# Patient Record
Sex: Female | Born: 1995 | Race: White | Hispanic: Yes | Marital: Married | Smoking: Never smoker
Health system: Southern US, Community
[De-identification: ages and names within clinical notes are randomized; demographics above are authoritative.]

## PROBLEM LIST (undated history)

## (undated) DIAGNOSIS — Z789 Other specified health status: Secondary | ICD-10-CM

## (undated) DIAGNOSIS — O24419 Gestational diabetes mellitus in pregnancy, unspecified control: Secondary | ICD-10-CM

## (undated) HISTORY — PX: WISDOM TOOTH EXTRACTION: SHX21

---
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
## 2017-10-07 ENCOUNTER — Encounter (HOSPITAL_COMMUNITY): Payer: Self-pay | Admitting: Emergency Medicine

## 2017-10-07 ENCOUNTER — Emergency Department (HOSPITAL_COMMUNITY): Payer: Self-pay

## 2017-10-07 ENCOUNTER — Emergency Department (HOSPITAL_COMMUNITY)
Admission: EM | Admit: 2017-10-07 | Discharge: 2017-10-08 | Disposition: A | Discharge status: Home / Self Care | Payer: Self-pay | Attending: Emergency Medicine | Admitting: Emergency Medicine

## 2017-10-07 ENCOUNTER — Emergency Department (HOSPITAL_COMMUNITY)
Admission: EM | Admit: 2017-10-07 | Discharge: 2017-10-07 | Disposition: A | Discharge status: Home / Self Care | Payer: Self-pay | Attending: Emergency Medicine | Admitting: Emergency Medicine

## 2017-10-07 ENCOUNTER — Other Ambulatory Visit: Payer: Self-pay

## 2017-10-07 DIAGNOSIS — S0990XA Unspecified injury of head, initial encounter: Secondary | ICD-10-CM | POA: Insufficient documentation

## 2017-10-07 DIAGNOSIS — W228XXA Striking against or struck by other objects, initial encounter: Secondary | ICD-10-CM | POA: Insufficient documentation

## 2017-10-07 DIAGNOSIS — R51 Headache: Secondary | ICD-10-CM | POA: Insufficient documentation

## 2017-10-07 DIAGNOSIS — Z5321 Procedure and treatment not carried out due to patient leaving prior to being seen by health care provider: Secondary | ICD-10-CM | POA: Insufficient documentation

## 2017-10-07 DIAGNOSIS — Y9389 Activity, other specified: Secondary | ICD-10-CM | POA: Insufficient documentation

## 2017-10-07 DIAGNOSIS — Y998 Other external cause status: Secondary | ICD-10-CM | POA: Insufficient documentation

## 2017-10-07 DIAGNOSIS — Y92000 Kitchen of unspecified non-institutional (private) residence as  the place of occurrence of the external cause: Secondary | ICD-10-CM | POA: Insufficient documentation
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
## 2017-10-07 NOTE — ED Triage Notes (Signed)
Patient left [facility]. earlier because she had a flight; decided she needed to be seen before taking this flight to make sure she was okay.

## 2017-10-07 NOTE — ED Notes (Signed)
Pt states she needs to leave because she has a flight in [place] in 1 1/2 hours and her family has her belongings and they are leaving to fly home.  States she is unable to stay to be seen.  Pt [facility] to [facility] to speak with pt.  Pt leaving AMA.

## 2017-10-08 MED ORDER — ONDANSETRON 4 MG PO TBDP: 4.0000 mg | ORAL_TABLET | Freq: Three times a day (TID) | ORAL | 0 refills | Status: DC | PRN
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
## 2017-10-08 NOTE — Discharge Instructions (Signed)
°  Head Injury You have been seen today for a head injury. It does not appear to be serious at this time.  Close observation: The close observation period is usually 6 hours from the injury. This includes staying awake and having a trustworthy adult monitor you to assure your condition does not worsen. You should be in regular contact with this person and ideally, they should be able to monitor you in person.  Secondary observation: The secondary observation period is usually 24 hours from the injury. You are allowed to sleep during this time. A trustworthy adult should intermittently monitor you to assure your condition does not worsen.   Overall head injury/concussion care: Rest: Be sure to get plenty of rest. You will need more rest and sleep while you recover. Hydration: Be sure to stay well hydrated by having a goal of drinking about 0.5 liters of water an hour. Zofran: May use Zofran, as needed for nausea.  Pain:  Antiinflammatory medications: Take 600 mg of ibuprofen every 6 hours or 440 mg (over the counter dose) to 500 mg (prescription dose) of naproxen every 12 hours or for the next 3 days. After this time, these medications may be used as needed for pain. Take these medications with food to avoid upset stomach. Choose only one of these medications, do not take them together. Tylenol: Should you continue to have additional pain while taking the ibuprofen or naproxen, you may add in tylenol as needed. Your daily total maximum amount of tylenol from all sources should be limited to 4000mg /day for persons without liver problems, or 2000mg /day for those with liver problems. Return to sports and activities: In general, you may return to normal activities once symptoms have subsided, however, you would ideally be cleared by a primary care provider or other qualified medical professional prior to return to these activities.  Follow up: Follow up with the concussion clinic or your primary care  provider for further management of this issue. Return: Return to the ED should you begin to have confusion, abnormal behavior, aggression, violence, or personality changes, repeated vomiting, vision loss, numbness or weakness on one side of the body, difficulty standing due to dizziness, significantly worsening pain, or any other major concerns.

## 2018-09-28 ENCOUNTER — Other Ambulatory Visit: Payer: Self-pay

## 2018-09-28 ENCOUNTER — Inpatient Hospital Stay (HOSPITAL_COMMUNITY)
Admission: EM | Admit: 2018-09-28 | Discharge: 2018-09-29 | Disposition: A | Discharge status: Home / Self Care | Payer: Medicaid Other | Attending: Obstetrics and Gynecology | Admitting: Obstetrics and Gynecology

## 2018-09-28 ENCOUNTER — Inpatient Hospital Stay (HOSPITAL_COMMUNITY): Payer: Medicaid Other

## 2018-09-28 ENCOUNTER — Encounter (HOSPITAL_COMMUNITY): Payer: Self-pay | Admitting: Emergency Medicine

## 2018-09-28 DIAGNOSIS — O468X1 Other antepartum hemorrhage, first trimester: Secondary | ICD-10-CM | POA: Diagnosis not present

## 2018-09-28 DIAGNOSIS — R109 Unspecified abdominal pain: Secondary | ICD-10-CM | POA: Insufficient documentation

## 2018-09-28 DIAGNOSIS — Z3A10 10 weeks gestation of pregnancy: Secondary | ICD-10-CM | POA: Diagnosis not present

## 2018-09-28 DIAGNOSIS — O418X10 Other specified disorders of amniotic fluid and membranes, first trimester, not applicable or unspecified: Secondary | ICD-10-CM

## 2018-09-28 DIAGNOSIS — O219 Vomiting of pregnancy, unspecified: Secondary | ICD-10-CM | POA: Diagnosis not present

## 2018-09-28 DIAGNOSIS — O208 Other hemorrhage in early pregnancy: Secondary | ICD-10-CM | POA: Insufficient documentation

## 2018-09-28 DIAGNOSIS — Z3491 Encounter for supervision of normal pregnancy, unspecified, first trimester: Secondary | ICD-10-CM

## 2018-09-28 DIAGNOSIS — O209 Hemorrhage in early pregnancy, unspecified: Secondary | ICD-10-CM

## 2018-09-28 HISTORY — DX: Other specified health status: Z78.9

## 2018-09-28 LAB — BASIC METABOLIC PANEL
Anion gap: 10 (ref 5–15)
BUN: 7 mg/dL (ref 6–20)
CO2: 20 mmol/L — ABNORMAL LOW (ref 22–32)
Calcium: 8.8 mg/dL — ABNORMAL LOW (ref 8.9–10.3)
Chloride: 104 mmol/L (ref 98–111)
Creatinine, Ser: 0.54 mg/dL (ref 0.44–1.00)
GFR calc Af Amer: >60 mL/min (ref >60)
GFR calc non Af Amer: >60 mL/min (ref >60)
Glucose, Bld: 97 mg/dL (ref 70–99)
Potassium: 3.4 mmol/L — ABNORMAL LOW (ref 3.5–5.1)
Sodium: 134 mmol/L — ABNORMAL LOW (ref 135–145)

## 2018-09-28 LAB — CBC WITH DIFFERENTIAL/PLATELET
Abs Immature Granulocytes: 0.02 10*3/uL (ref 0.00–0.07)
Basophils Absolute: 0 10*3/uL (ref 0.0–0.1)
Basophils Relative: 0 %
Eosinophils Absolute: 0 10*3/uL (ref 0.0–0.5)
Eosinophils Relative: 1 %
HCT: 39.8 % (ref 36.0–46.0)
Hemoglobin: 13.4 g/dL (ref 12.0–15.0)
Immature Granulocytes: 0 %
Lymphocytes Relative: 22 %
Lymphs Abs: 1.5 10*3/uL (ref 0.7–4.0)
MCH: 29.3 pg (ref 26.0–34.0)
MCHC: 33.7 g/dL (ref 30.0–36.0)
MCV: 86.9 fL (ref 80.0–100.0)
Monocytes Absolute: 0.9 10*3/uL (ref 0.1–1.0)
Monocytes Relative: 13 %
Neutro Abs: 4.3 10*3/uL (ref 1.7–7.7)
Neutrophils Relative %: 64 %
Platelets: 241 10*3/uL (ref 150–400)
RBC: 4.58 MIL/uL (ref 3.87–5.11)
RDW: 11.9 % (ref 11.5–15.5)
WBC: 6.8 10*3/uL (ref 4.0–10.5)
nRBC: 0 % (ref 0.0–0.2)

## 2018-09-28 LAB — URINALYSIS, ROUTINE W REFLEX MICROSCOPIC
Bilirubin Urine: NEGATIVE
Glucose, UA: NEGATIVE mg/dL
Ketones, ur: NEGATIVE mg/dL
Nitrite: POSITIVE — AB
Protein, ur: NEGATIVE mg/dL
Specific Gravity, Urine: 1.027 (ref 1.005–1.030)
pH: 6 (ref 5.0–8.0)

## 2018-09-28 LAB — I-STAT BETA HCG BLOOD, ED (MC, WL, AP ONLY): I-stat hCG, quantitative: >2000 m[IU]/mL — ABNORMAL HIGH (ref <5)

## 2018-09-28 LAB — HCG, QUANTITATIVE, PREGNANCY: hCG, Beta Chain, Quant, S: 62071 m[IU]/mL — ABNORMAL HIGH (ref <5)

## 2018-09-28 NOTE — ED Triage Notes (Signed)
Patient reports right lateral abdominal cramping with spotting and emesis onset today , she is [redacted] weeks pregnant G2P1. Denies fever or chills .

## 2018-09-28 NOTE — MAU Note (Signed)
Pt reports she started having some abd cramping this afternoon. Had some vag bleeding  Earlier this evening but none now. Went to [facility] and was transferred to MAU for further evaluation.

## 2018-09-29 DIAGNOSIS — O418X10 Other specified disorders of amniotic fluid and membranes, first trimester, not applicable or unspecified: Secondary | ICD-10-CM

## 2018-09-29 DIAGNOSIS — O219 Vomiting of pregnancy, unspecified: Secondary | ICD-10-CM

## 2018-09-29 DIAGNOSIS — O468X1 Other antepartum hemorrhage, first trimester: Secondary | ICD-10-CM

## 2018-09-29 DIAGNOSIS — Z3A10 10 weeks gestation of pregnancy: Secondary | ICD-10-CM

## 2018-09-29 DIAGNOSIS — O209 Hemorrhage in early pregnancy, unspecified: Secondary | ICD-10-CM

## 2018-09-29 LAB — WET PREP, GENITAL
Sperm: NONE SEEN
Trich, Wet Prep: NONE SEEN
Yeast Wet Prep HPF POC: NONE SEEN

## 2018-09-29 MED ORDER — PROMETHAZINE HCL 25 MG PO TABS: 12.5000 mg | ORAL_TABLET | Freq: Four times a day (QID) | ORAL | 0 refills | Status: DC | PRN

## 2018-09-29 NOTE — MAU Provider Note (Signed)
Chief Complaint: Abdominal Cramping/Emesis/Spotting ([redacted] weeks pregnant)   None     SUBJECTIVE HPI: Tammie Diaz is a 23 y.o. Tammie Diaz at 10 weeks by LMP who presents to the ED and was transferred to maternity admissions reporting abdominal cramping, spotting, and nausea vomiting today with [facility]. She reports the bleeding is light, pink to light red, noted when wiping. She reports nausea with vomiting most days but not every day.  There are no other symptoms. She has not tried any treatments.     HPI  Past Medical History:  Diagnosis Date  . Medical history non-contributory    Past Surgical History:  Procedure Laterality Date  . CESAREAN SECTION     Social History   Socioeconomic History  . Marital status: Married    Spouse name: Not on file  . Number of children: Not on file  . Years of education: Not on file  . Highest education level: Not on file  Occupational History  . Not on file  Social Needs  . Financial resource strain: Not on file  . Food insecurity    Worry: Not on file    Inability: Not on file  . Transportation needs    Medical: Not on file    Non-medical: Not on file  Tobacco Use  . Smoking status: Never Smoker  . Smokeless tobacco: Never Used  Substance and Sexual Activity  . Alcohol use: Yes  . Drug use: Never  . Sexual activity: Yes  Lifestyle  . Physical activity    Days per week: Not on file    Minutes per session: Not on file  . Stress: Not on file  Relationships  . Social Musicianconnections    Talks on phone: Not on file    Gets together: Not on file    Attends religious service: Not on file    Active member of club or organization: Not on file    Attends meetings of clubs or organizations: Not on file    Relationship status: Not on file  . Intimate partner violence    Fear of current or ex partner: Not on file    Emotionally abused: Not on file    Physically abused: Not on file    Forced sexual activity: Not on file   Other Topics Concern  . Not on file  Social History Narrative  . Not on file   No current facility-administered medications on file prior to encounter.    Current Outpatient Medications on File Prior to Encounter  Medication Sig Dispense Refill  . ondansetron (ZOFRAN ODT) 4 MG disintegrating tablet Take 1 tablet (4 mg total) by mouth every 8 (eight) hours as needed for nausea or vomiting. 20 tablet 0   No Known Allergies  ROS:  Review of Systems  Constitutional: Negative for chills, fatigue and fever.  Respiratory: Negative for shortness of breath.   Cardiovascular: Negative for chest pain.  Gastrointestinal: Positive for abdominal pain, nausea and vomiting.  Genitourinary: Positive for pelvic pain and vaginal bleeding. Negative for difficulty urinating, dysuria, flank pain, vaginal discharge and vaginal pain.  Neurological: Negative for dizziness and headaches.  Psychiatric/Behavioral: Negative.      I have reviewed patient's Past Medical Hx, Surgical Hx, Family Hx, Social Hx, medications and allergies.   Physical Exam   Patient Vitals for the past 24 hrs:  BP Temp Temp src Pulse Resp Height Weight  09/29/18 0028 114/69 97.9 F (36.6 C) Oral 61 16 - -  09/28/18 2320  105/66 97.8 F (36.6 C) Oral 63 17 - -  09/28/18 2237 115/69 97.6 F (36.4 C) - 73 18 5' 2.5" (1.588 m) 68 kg   Constitutional: Well-developed, well-nourished female in no acute distress.  Cardiovascular: normal rate Respiratory: normal effort GI: Abd soft, non-tender. Pos BS x 4 MS: Extremities nontender, no edema, normal ROM Neurologic: Alert and oriented x 4.  GU: Neg CVAT.  PELVIC EXAM: Wet prep collected by blind swab    LAB RESULTS Results for orders placed or performed during the hospital encounter of 09/28/18 (from the past 24 hour(s))  CBC with Differential     Status: None   Collection Time: 09/28/18  8:30 PM  Result Value Ref Range   WBC 6.8 4.0 - 10.5 K/uL   RBC 4.58 3.87 - 5.11  MIL/uL   Hemoglobin 13.4 12.0 - 15.0 g/dL   HCT 18.8 67.7 - 37.3 %   MCV 86.9 80.0 - 100.0 fL   MCH 29.3 26.0 - 34.0 pg   MCHC 33.7 30.0 - 36.0 g/dL   RDW 66.8 15.9 - 47.0 %   Platelets 241 150 - 400 K/uL   nRBC 0.0 0.0 - 0.2 %   Neutrophils Relative % 64 %   Neutro Abs 4.3 1.7 - 7.7 K/uL   Lymphocytes Relative 22 %   Lymphs Abs 1.5 0.7 - 4.0 K/uL   Monocytes Relative 13 %   Monocytes Absolute 0.9 0.1 - 1.0 K/uL   Eosinophils Relative 1 %   Eosinophils Absolute 0.0 0.0 - 0.5 K/uL   Basophils Relative 0 %   Basophils Absolute 0.0 0.0 - 0.1 K/uL   Immature Granulocytes 0 %   Abs Immature Granulocytes 0.02 0.00 - 0.07 K/uL  Basic metabolic panel     Status: Abnormal   Collection Time: 09/28/18  8:30 PM  Result Value Ref Range   Sodium 134 (L) 135 - 145 mmol/L   Potassium 3.4 (L) 3.5 - 5.1 mmol/L   Chloride 104 98 - 111 mmol/L   CO2 20 (L) 22 - 32 mmol/L   Glucose, Bld 97 70 - 99 mg/dL   BUN 7 6 - 20 mg/dL   Creatinine, Ser 7.61 0.44 - 1.00 mg/dL   Calcium 8.8 (L) 8.9 - 10.3 mg/dL   GFR calc non Af Amer >60 >60 mL/min   GFR calc Af Amer >60 >60 mL/min   Anion gap 10 5 - 15  hCG, quantitative, pregnancy     Status: Abnormal   Collection Time: 09/28/18  8:30 PM  Result Value Ref Range   hCG, Beta Chain, Quant, S 62,071 (H) <5 mIU/mL  Urinalysis, Routine w reflex microscopic     Status: Abnormal   Collection Time: 09/28/18  8:30 PM  Result Value Ref Range   Color, Urine YELLOW YELLOW   APPearance HAZY (A) CLEAR   Specific Gravity, Urine 1.027 1.005 - 1.030   pH 6.0 5.0 - 8.0   Glucose, UA NEGATIVE NEGATIVE mg/dL   Hgb urine dipstick MODERATE (A) NEGATIVE   Bilirubin Urine NEGATIVE NEGATIVE   Ketones, ur NEGATIVE NEGATIVE mg/dL   Protein, ur NEGATIVE NEGATIVE mg/dL   Nitrite POSITIVE (A) NEGATIVE   Leukocytes,Ua SMALL (A) NEGATIVE   RBC / HPF 0-5 0 - 5 RBC/hpf   WBC, UA 21-50 0 - 5 WBC/hpf   Bacteria, UA MANY (A) NONE SEEN   Squamous Epithelial / LPF 6-10 0 - 5    Mucus PRESENT   ABO/Rh     Status:  None   Collection Time: 09/28/18  8:30 PM  Result Value Ref Range   ABO/RH(D)      O POS Performed at [facility] [address] [place], [place] [address]   I-Stat Beta hCG blood, ED ([facility], [facility], [facility] only)     Status: Abnormal   Collection Time: 09/28/18  9:05 PM  Result Value Ref Range   I-stat hCG, quantitative >2,000.0 (H) <5 mIU/mL   Comment 3            --/--/O POS Performed at [facility] [address], [place], [place] [address]  [phone])  IMAGING US Ob Less Than 14 Weeks With Ob Transvaginal  Result Date: 09/29/2018 CLINICAL DATA:  Spotting EXAM: OBSTETRIC <14 WK [place] AND TRANSVAGINAL OB US TECHNIQUE: Both transabdominal and transvaginal ultrasound examinations were performed for complete evaluation of the gestation as well as the maternal uterus, adnexal regions, and pelvic cul-de-sac. Transvaginal technique was performed to assess early pregnancy. COMPARISON:  None. FINDINGS: Intrauterine gestational sac: Single Yolk sac:  Visualized Embryo:  Visualized Cardiac Activity: Visualized Heart Rate: 171 bpm MSD:   mm    w     d CRL:  20.6 mm   8 w   4 d                  [place] EDC: 05/06/2019 Subchorionic hemorrhage:  Small subchorionic hemorrhage Maternal uterus/adnexae: No adnexal mass or free fluid. IMPRESSION: Eight week 4 day intrauterine pregnancy. Fetal heart rate 171 beats per minute. Small subchorionic hemorrhage. Electronically Signed   By: Rolm Baptise M.D.   On: 09/29/2018 00:03    MAU Management/MDM: Orders Placed This Encounter  Procedures  . Wet prep, genital  . US OB LESS THAN 14 WEEKS WITH OB TRANSVAGINAL  . CBC with Differential  . Basic metabolic panel  . hCG, quantitative, pregnancy  . Urinalysis, Routine w reflex microscopic  . I-Stat Beta hCG blood, ED ([facility], [facility], [facility] only)  . ABO/Rh  . Discharge patient    No orders of the defined types were placed in this encounter.   [place] confirms IUP with small subchorionic  hemorrhage, likely explained the pain and bleeding. Discussed results with pt today.  EDD changed to reflect [place], as it is 13 days different from LMP dating.  Pt to start prenatal care as soon as possible, list of providers given.  Will treat n/v with Phenergan, Rx sent.  Return to MAU with worsening pain or bleeding.  Pt discharged with strict return precautions.  ASSESSMENT 1. Normal IUP (intrauterine pregnancy) on prenatal ultrasound, first trimester   2. Vaginal bleeding in pregnancy, first trimester   3. Subchorionic hemorrhage of placenta in first trimester, single or unspecified fetus     PLAN Discharge home Allergies as of 09/29/2018   No Known Allergies     Medication List    STOP taking these medications   ondansetron 4 MG disintegrating tablet Commonly known as: Zofran ODT      Follow-up [facility] Follow up.   Specialty: Obstetrics and Gynecology Why: Or prenatal provider of your choice. Return to MAU as needed for emergencies. Contact information: [address], [place] Certified Nurse-Midwife 09/29/2018  12:29 AM

## 2018-09-29 NOTE — Discharge Instructions (Signed)
[place] [facility] Providers    [facility]       Phone: [phone]  [facility]   Phone: [phone]  [facility]  Phone: [phone]  [facility]  Phone: [phone]  [facility]  Phone: [phone]  [facility]   Phone: [phone]  [facility]       Phone: [phone]  [facility]    Phone: [phone]   [facility]    Phone: [phone]  [facility]    Phone: [phone]  [facility]    Phone: [phone]  [facility]       Phone: [phone]   [facility]  Phone: [phone]  [facility]    Phone: [phone]  [facility]   Phone: [phone]  [facility]      Phone: [phone]  [facility]    Phone: [phone]   

## 2018-09-30 LAB — ABO/RH: ABO/RH(D): O POS

## 2018-10-02 LAB — GC/CHLAMYDIA PROBE AMP (~~LOC~~) NOT AT ARMC
Chlamydia: NEGATIVE
Neisseria Gonorrhea: NEGATIVE

## 2018-10-07 ENCOUNTER — Telehealth: Payer: Self-pay | Admitting: General Practice

## 2018-10-07 NOTE — Telephone Encounter (Signed)
Unable to reach pt to inform her of change of appointment.  Pt was scheduled on 10/08/2018 for New OB intake.  Rescheduled pt to 10/15/2018 at 2:30pm due to RN will not be available.  Unable to leave a message on voice mail.

## 2018-10-09 ENCOUNTER — Encounter: Payer: Medicaid Other | Admitting: Certified Nurse Midwife

## 2018-10-15 ENCOUNTER — Ambulatory Visit (INDEPENDENT_AMBULATORY_CARE_PROVIDER_SITE_OTHER): Payer: Medicaid Other | Admitting: *Deleted

## 2018-10-15 ENCOUNTER — Other Ambulatory Visit: Payer: Self-pay

## 2018-10-15 DIAGNOSIS — Z3480 Encounter for supervision of other normal pregnancy, unspecified trimester: Secondary | ICD-10-CM | POA: Insufficient documentation

## 2018-10-15 NOTE — Progress Notes (Signed)
    Virtual Visit via Telephone Note  I connected with Tammie Diaz on 10/15/18 at  2:30 PM EDT by telephone and verified that I am speaking with the correct person using two identifiers.  Location: Patient: Tammie Diaz MRN: [id number] Provider: Derl Barrow, RN   I discussed the limitations, risks, security and privacy concerns of performing an evaluation and management service by telephone and the availability of in person appointments. I also discussed with the patient that there may be a patient responsible charge related to this service. The patient expressed understanding and agreed to proceed.   History of Present Illness: PRENATAL INTAKE SUMMARY  Ms. Tammie Diaz presents today New OB Nurse Interview.  OB History    Gravida  2   Para  1   Term  1   Preterm      AB      Living  1     SAB      TAB      Ectopic      Multiple      Live Births  1          I have reviewed the patient's medical, obstetrical, social, and family histories, medications, and available lab results.  SUBJECTIVE She has no unusual complaints   Observations/Objective: Initial nurse interview for history/labs (New OB)  EDD: 05/07/2019 GA: [redacted]w[redacted]d G2P1001 FHT: non face to face interview  GENERAL APPEARANCE: non face to face interviw  Assessment and Plan: Normal pregnancy Prenatal care- [facility] Continue PNV Lab/physical to completed at next visit with provider Pt to sign up for Babyscripts  Follow Up Instructions:   I discussed the assessment and treatment plan with the patient. The patient was provided an opportunity to ask questions and all were answered. The patient agreed with the plan and demonstrated an understanding of the instructions.   The patient was advised to call back or seek an in-person evaluation if the symptoms worsen or if the condition fails to improve as anticipated.  I provided 15 minutes of non-face-to-face time during this encounter.    Derl Barrow, RN

## 2018-10-18 ENCOUNTER — Encounter: Payer: Medicaid Other | Admitting: Student

## 2018-10-30 ENCOUNTER — Other Ambulatory Visit (HOSPITAL_COMMUNITY)
Admission: RE | Admit: 2018-10-30 | Discharge: 2018-10-30 | Disposition: A | Discharge status: Home / Self Care | Payer: Medicaid Other | Source: Ambulatory Visit

## 2018-10-30 ENCOUNTER — Encounter: Payer: Self-pay | Admitting: General Practice

## 2018-10-30 ENCOUNTER — Other Ambulatory Visit: Payer: Self-pay

## 2018-10-30 ENCOUNTER — Ambulatory Visit (INDEPENDENT_AMBULATORY_CARE_PROVIDER_SITE_OTHER): Payer: Medicaid Other

## 2018-10-30 VITALS — BP 112/72 | HR 99 | Temp 99.0°F | Wt 149.0 lb

## 2018-10-30 DIAGNOSIS — Z8751 Personal history of pre-term labor: Secondary | ICD-10-CM | POA: Insufficient documentation

## 2018-10-30 DIAGNOSIS — Z3480 Encounter for supervision of other normal pregnancy, unspecified trimester: Secondary | ICD-10-CM | POA: Insufficient documentation

## 2018-10-30 DIAGNOSIS — Z3A13 13 weeks gestation of pregnancy: Secondary | ICD-10-CM | POA: Diagnosis not present

## 2018-10-30 DIAGNOSIS — O219 Vomiting of pregnancy, unspecified: Secondary | ICD-10-CM | POA: Diagnosis not present

## 2018-10-30 DIAGNOSIS — Z23 Encounter for immunization: Secondary | ICD-10-CM

## 2018-10-30 DIAGNOSIS — Z98891 History of uterine scar from previous surgery: Secondary | ICD-10-CM | POA: Insufficient documentation

## 2018-10-30 HISTORY — DX: Personal history of pre-term labor: Z87.51

## 2018-10-30 MED ORDER — ONDANSETRON 4 MG PO TBDP: 4.0000 mg | ORAL_TABLET | Freq: Four times a day (QID) | ORAL | 1 refills | Status: DC | PRN

## 2018-10-30 NOTE — Progress Notes (Addendum)
Subjective:   Tammie Diaz is a 23 y.o. G2P1001 at [redacted]w[redacted]d by early ultrasound being seen today for her first obstetrical visit.  Her obstetrical history is significant for C/S and PTL.  Patient reports that she had a c/s after a failed induction of 3 days.  Patient goes on to state that she experienced some PTL around 36 weeks and she was 3 cm, but labor was stopped. Patient denies other issues with previous pregnancy and reports having a healthy child at home.   Patient does intend to breast feed. Patient expresses a desire for a repeat c/s. Patient currently taking PNV.   Patient reports vomiting.  She states she has been taking the medication as prescribed, but continues to experience nausea and vomiting.  Patient reports that she has been able to eat minimally and feels dehydrated. However, patient goes on to report ability to tolerate Pedialyte without issue and has consumed prior to her appt.   Patient reports that the FOB is involved.    HISTORY: OB History  Gravida Para Term Preterm AB Living  2 1 1  0 0 1  SAB TAB Ectopic Multiple Live Births  0 0 0 0 1    # Outcome Date GA Lbr Len/2nd Weight Sex Delivery Anes PTL Lv  2 Current           1 Term 07/04/16 [redacted]w[redacted]d  7 lb 7 oz (3.374 kg) F CS-Unspec EPI N LIV     Complications: Short cervix    Last pap smear was done today and results pending.   Past Medical History:  Diagnosis Date  . Medical history non-contributory    Past Surgical History:  Procedure Laterality Date  . CESAREAN SECTION     No family history on file. Social History   Tobacco Use  . Smoking status: Never Smoker  . Smokeless tobacco: Never Used  Substance Use Topics  . Alcohol use: Not Currently  . Drug use: Never   No Known Allergies Current Outpatient Medications on File Prior to Visit  Medication Sig Dispense Refill  . promethazine (PHENERGAN) 25 MG tablet Take 0.5-1 tablets (12.5-25 mg total) by mouth every 6 (six) hours as needed for  nausea. 30 tablet 0   No current facility-administered medications on file prior to visit.     Review of Systems Pertinent items noted in HPI and remainder of comprehensive ROS otherwise negative.  Exam   Vitals:   10/30/18 1013  BP: 112/72  Pulse: 99  Temp: 99 F (37.2 C)  Weight: 149 lb (67.6 kg)      Physical Exam Constitutional:      Appearance: Normal appearance.  Genitourinary:     Vulva normal.     Vaginal discharge (Thick white discharge, no apparent odor. ) present.     No vaginal bleeding.     Cervix is parous.     Cervical discharge and friability present.     No cervical motion tenderness, lesion, erythema, polyp or nabothian cyst.     Uterus is enlarged.     Uterus is not tender.     Genitourinary Comments: Cervical bleeding noted after collection of pap via brush and spatula.  Patient informed of anticipated bleeding/spotting. CV collected.   HENT:     Head: Normocephalic and atraumatic.  Eyes:     Conjunctiva/sclera: Conjunctivae normal.  Neck:     Musculoskeletal: Normal range of motion.  Cardiovascular:     Rate and Rhythm: Normal rate and regular  rhythm.  Pulmonary:     Effort: Pulmonary effort is normal.     Breath sounds: Normal breath sounds.  Abdominal:     General: Abdomen is flat. Bowel sounds are normal.     Palpations: Abdomen is soft.     Tenderness: There is abdominal tenderness (Right lower quadrant at incision site. ).     Comments: LT Incision noted; Healed, tenderness with palpation.   Musculoskeletal: Normal range of motion.  Neurological:     Mental Status: She is alert and oriented to person, place, and time.  Skin:    General: Skin is warm and dry.  Psychiatric:        Mood and Affect: Mood normal.        Thought Content: Thought content normal.        Assessment:   Pregnancy: G2P1001 Patient Active Problem List   Diagnosis Date Noted  . Supervision of other normal pregnancy, antepartum 10/15/2018     Plan:  1.  Supervision of other normal pregnancy, antepartum -Congratulations given and patient welcomed to practice. -Discussed usage of Babyscripts as for documentation of weekly at home blood pressures.  -Discussed usage of virtual visits in midst of coronavirus.   -Encouraged to seek out care at office or emergency room for urgent and/or emergent concerns. -Educated on the nature of [facility] with multiple MDs and other Advanced Practice Providers was explained to patient; also emphasized that residents, students are part of our team. Informed of her right to refuse care as she deems appropriate.  -No questions or concerns.   -Anticipatory guidance for prenatal visits including labs, ultrasounds, and testing. -Encouraged to utilize MyChart to review results, send requests, and have questions addressed.   -Discussed due date based on Early [place]. -Plans to breastfeed and is unsure of PP BC method, but is considering Nexplanon. -Unsure of circumcision if a female child.   Initial labs drawn. Continue prenatal vitamins. Genetic Screening discussed, First trimester screen: ordered. Ultrasound discussed; fetal anatomic survey: ordered. Problem list updated. Routine obstetric precautions reviewed.  - Genetic Screening - Obstetric Panel, Including HIV - Cytology - PAP( [place]) - Cervicovaginal ancillary only( [place]) - Culture, OB Urine  2. History of low transverse cesarean section -Reports desire for repeat. -Informed that she would need to speak/meet with with a surgeon prior to scheduling.  -Will have patient sign release for records.   3. History of preterm labor -Will obtain medical records for review. -Informed that if short cervix is noted,  further intervention may be necessary.  4. Nausea and vomiting during pregnancy prior to [redacted] weeks gestation -Discussed eating habits in setting of N/V including: *Frequent small bland meals throughout  the day. *Sips of fluid -Discussed proper usage of antiemetics. -Will send prescription for Zofran ODT to pharmacy on file. -Encouraged to report to MAU for further evaluation if unable to keep food or fluids down after initiation of Zofran today.  5. Need for immunization against influenza -Influenza offered, accepted, and given.   Return in about 7 weeks (around 12/18/2018) for ROB via Webex/Mychart.   Cherre Robins, CNM 10/30/2018 10:49 AM

## 2018-10-30 NOTE — Patient Instructions (Signed)
Morning Sickness ° °Morning sickness is when a woman feels nauseous during pregnancy. This nauseous feeling may or may not come with vomiting. It often occurs in the morning, but it can be a problem at any time of day. Morning sickness is most common during the first trimester. In some cases, it may continue throughout pregnancy. Although morning sickness is unpleasant, it is usually harmless unless the woman develops severe and continual vomiting (hyperemesis gravidarum), a condition that requires more intense treatment. °What are the causes? °The exact cause of this condition is not known, but it seems to be related to normal hormonal changes that occur in pregnancy. °What increases the risk? °You are more likely to develop this condition if: °· You experienced nausea or vomiting before your pregnancy. °· You had morning sickness during a previous pregnancy. °· You are pregnant with more than one baby, such as twins. °What are the signs or symptoms? °Symptoms of this condition include: °· Nausea. °· Vomiting. °How is this diagnosed? °This condition is usually diagnosed based on your signs and symptoms. °How is this treated? °In many cases, treatment is not needed for this condition. Making some changes to what you eat may help to control symptoms. Your health care provider may also prescribe or recommend: °· Vitamin B6 supplements. °· Anti-nausea medicines. °· Ginger. °Follow these instructions at home: °Medicines °· Take over-the-counter and prescription medicines only as told by your health care provider. Do not use any prescription, over-the-counter, or herbal medicines for morning sickness without first talking with your health care provider. °· Taking multivitamins before getting pregnant can prevent or decrease the severity of morning sickness in most women. °Eating and drinking °· Eat a piece of dry toast or crackers before getting out of bed in the morning. °· Eat 5 or 6 small meals a day. °· Eat dry and  bland foods, such as rice or a baked potato. Foods that are high in carbohydrates are often helpful. °· Avoid greasy, fatty, and spicy foods. °· Have someone cook for you if the smell of any food causes nausea and vomiting. °· If you feel nauseous after taking prenatal vitamins, take the vitamins at night or with a snack. °· Snack on protein foods between meals if you are hungry. Nuts, yogurt, and cheese are good options. °· Drink fluids throughout the day. °· Try ginger ale made with real ginger, ginger tea made from fresh grated ginger, or ginger candies. °General instructions °· Do not use any products that contain nicotine or tobacco, such as cigarettes and e-cigarettes. If you need help quitting, ask your health care provider. °· Get an air purifier to keep the air in your house free of odors. °· Get plenty of fresh air. °· Try to avoid odors that trigger your nausea. °· Consider trying these methods to help relieve symptoms: °? Wearing an acupressure wristband. These wristbands are often worn for seasickness. °? Acupuncture. °Contact a health care provider if: °· Your home remedies are not working and you need medicine. °· You feel dizzy or light-headed. °· You are losing weight. °Get help right away if: °· You have persistent and uncontrolled nausea and vomiting. °· You faint. °· You have severe pain in your abdomen. °Summary °· Morning sickness is when a woman feels nauseous during pregnancy. This nauseous feeling may or may not come with vomiting. °· Morning sickness is most common during the first trimester. °· It often occurs in the morning, but it can be a problem at   any time of day. °· In many cases, treatment is not needed for this condition. Making some changes to what you eat may help to control symptoms. °This information is not intended to replace advice given to you by your health care provider. Make sure you discuss any questions you have with your health care provider. °Document Released:  03/02/2006 Document Revised: 12/22/2016 Document Reviewed: 02/12/2016 °Elsevier Patient Education © 2020 [employer] ° °

## 2018-10-31 ENCOUNTER — Encounter: Payer: Self-pay | Admitting: General Practice

## 2018-10-31 LAB — OBSTETRIC PANEL, INCLUDING HIV
Antibody Screen: NEGATIVE
Basophils Absolute: 0 10*3/uL (ref 0.0–0.2)
Basos: 0 %
EOS (ABSOLUTE): 0 10*3/uL (ref 0.0–0.4)
Eos: 0 %
HIV Screen 4th Generation wRfx: NONREACTIVE
Hematocrit: 41.2 % (ref 34.0–46.6)
Hemoglobin: 13.6 g/dL (ref 11.1–15.9)
Hepatitis B Surface Ag: NEGATIVE
Immature Grans (Abs): 0 10*3/uL (ref 0.0–0.1)
Immature Granulocytes: 0 %
Lymphocytes Absolute: 1 10*3/uL (ref 0.7–3.1)
Lymphs: 13 %
MCH: 29.2 pg (ref 26.6–33.0)
MCHC: 33 g/dL (ref 31.5–35.7)
MCV: 89 fL (ref 79–97)
Monocytes Absolute: 0.6 10*3/uL (ref 0.1–0.9)
Monocytes: 7 %
Neutrophils Absolute: 6.2 10*3/uL (ref 1.4–7.0)
Neutrophils: 80 %
Platelets: 245 10*3/uL (ref 150–450)
RBC: 4.65 x10E6/uL (ref 3.77–5.28)
RDW: 12.6 % (ref 11.7–15.4)
RPR Ser Ql: NONREACTIVE
Rh Factor: POSITIVE
Rubella Antibodies, IGG: 8.11 index (ref >0.99)
WBC: 7.8 10*3/uL (ref 3.4–10.8)

## 2018-11-03 ENCOUNTER — Other Ambulatory Visit (HOSPITAL_COMMUNITY): Payer: Self-pay

## 2018-11-03 DIAGNOSIS — O2342 Unspecified infection of urinary tract in pregnancy, second trimester: Secondary | ICD-10-CM

## 2018-11-03 LAB — CULTURE, OB URINE

## 2018-11-03 LAB — URINE CULTURE, OB REFLEX

## 2018-11-03 MED ORDER — CEPHALEXIN 500 MG PO CAPS: 500.0000 mg | ORAL_CAPSULE | Freq: Four times a day (QID) | ORAL | 0 refills | Status: DC

## 2018-11-05 LAB — CYTOLOGY - PAP: Diagnosis: NEGATIVE

## 2018-11-07 LAB — CERVICOVAGINAL ANCILLARY ONLY
Chlamydia: NEGATIVE
Comment: NEGATIVE
Comment: NORMAL
Neisseria Gonorrhea: NEGATIVE

## 2018-11-11 ENCOUNTER — Encounter: Payer: Self-pay | Admitting: General Practice

## 2018-12-05 ENCOUNTER — Other Ambulatory Visit: Payer: Self-pay | Admitting: *Deleted

## 2018-12-05 DIAGNOSIS — O219 Vomiting of pregnancy, unspecified: Secondary | ICD-10-CM

## 2018-12-05 MED ORDER — ONDANSETRON 4 MG PO TBDP: 4.0000 mg | ORAL_TABLET | Freq: Four times a day (QID) | ORAL | 1 refills | Status: DC | PRN

## 2018-12-11 ENCOUNTER — Ambulatory Visit (HOSPITAL_COMMUNITY)
Admission: RE | Admit: 2018-12-11 | Discharge: 2018-12-11 | Disposition: A | Discharge status: Home / Self Care | Payer: Medicaid Other | Source: Ambulatory Visit

## 2018-12-11 ENCOUNTER — Other Ambulatory Visit: Payer: Self-pay

## 2018-12-11 DIAGNOSIS — Z3A19 19 weeks gestation of pregnancy: Secondary | ICD-10-CM | POA: Diagnosis not present

## 2018-12-11 DIAGNOSIS — Z3480 Encounter for supervision of other normal pregnancy, unspecified trimester: Secondary | ICD-10-CM

## 2018-12-11 DIAGNOSIS — O34219 Maternal care for unspecified type scar from previous cesarean delivery: Secondary | ICD-10-CM

## 2018-12-11 DIAGNOSIS — Z363 Encounter for antenatal screening for malformations: Secondary | ICD-10-CM

## 2018-12-16 ENCOUNTER — Encounter: Payer: Self-pay | Admitting: General Practice

## 2018-12-18 ENCOUNTER — Telehealth: Payer: Medicaid Other | Admitting: Obstetrics and Gynecology

## 2019-01-03 ENCOUNTER — Telehealth (INDEPENDENT_AMBULATORY_CARE_PROVIDER_SITE_OTHER): Payer: Medicaid Other | Admitting: Advanced Practice Midwife

## 2019-01-03 ENCOUNTER — Encounter: Payer: Self-pay | Admitting: Advanced Practice Midwife

## 2019-01-03 ENCOUNTER — Other Ambulatory Visit: Payer: Self-pay

## 2019-01-03 ENCOUNTER — Telehealth: Payer: Self-pay | Admitting: General Practice

## 2019-01-03 DIAGNOSIS — Z3482 Encounter for supervision of other normal pregnancy, second trimester: Secondary | ICD-10-CM | POA: Diagnosis not present

## 2019-01-03 DIAGNOSIS — Z3A22 22 weeks gestation of pregnancy: Secondary | ICD-10-CM

## 2019-01-03 DIAGNOSIS — Z3480 Encounter for supervision of other normal pregnancy, unspecified trimester: Secondary | ICD-10-CM

## 2019-01-03 NOTE — Progress Notes (Signed)
   TELEHEALTH VIRTUAL OBSTETRICS VISIT ENCOUNTER NOTE  I connected with Tammie Diaz on 01/03/19 at 10:50 AM EST by telephone at home and verified that I am speaking with the correct person using two identifiers.   I discussed the limitations, risks, security and privacy concerns of performing an evaluation and management service by telephone and the availability of in person appointments. I also discussed with the patient that there may be a patient responsible charge related to this service. The patient expressed understanding and agreed to proceed.  Subjective:  Tammie Diaz is a 23 y.o. G2P1001 at [redacted]w[redacted]d being followed for ongoing prenatal care.  She is currently monitored for the following issues for this low-risk pregnancy and has Supervision of other normal pregnancy, antepartum; Nausea and vomiting during pregnancy prior to [redacted] weeks gestation; History of preterm labor; and History of low transverse cesarean section on their problem list.  Patient reports no complaints. Reports fetal movement. Denies any contractions, bleeding or leaking of fluid.   The following portions of the patient's history were reviewed and updated as appropriate: allergies, current medications, past family history, past medical history, past social history, past surgical history and problem list.   Objective:   General:  Alert, oriented and cooperative.   Mental Status: Normal mood and affect perceived. Normal judgment and thought content.  Rest of physical exam deferred due to type of encounter  BP 100/77 today  Assessment and Plan:  Pregnancy: G2P1001 at [redacted]w[redacted]d 1. Supervision of other normal pregnancy, antepartum - Routine care - 28 week labs and GTT in January   Preterm labor symptoms and general obstetric precautions including but not limited to vaginal bleeding, contractions, leaking of fluid and fetal movement were reviewed in detail with the patient.  I discussed the assessment and treatment plan  with the patient. The patient was provided an opportunity to ask questions and all were answered. The patient agreed with the plan and demonstrated an understanding of the instructions. The patient was advised to call back or seek an in-person office evaluation/go to [facility]U at [facility] for any urgent or concerning symptoms. Please refer to After Visit Summary for other counseling recommendations.   I provided 12 minutes of non-face-to-face time during this encounter.  Return in about 6 weeks (around 02/14/2019) for In person visit for 28 week labs at GTT .  Future Appointments  Date Time Provider [place]  01/15/2019 11:10 AM Jorje Guild, NP [facility] None  02/13/2019  8:10 AM Laury Deep, CNM [facility] None    Marcille Buffy DNP, CNM  01/03/19  11:07 AM  [facility]

## 2019-01-03 NOTE — Telephone Encounter (Signed)
Appt scheduled for 01/15/2019 has been cancelled due to provider wants pt to follow up around 02/13/2019.  Left message on VM informing pt of reason of cancelling and was asked to give our office a call with any questions or concerns.

## 2019-01-14 DIAGNOSIS — O219 Vomiting of pregnancy, unspecified: Secondary | ICD-10-CM

## 2019-01-15 ENCOUNTER — Telehealth: Payer: Medicaid Other | Admitting: Student

## 2019-01-15 MED ORDER — ONDANSETRON 4 MG PO TBDP: 4.0000 mg | ORAL_TABLET | Freq: Four times a day (QID) | ORAL | 1 refills | Status: DC | PRN

## 2019-02-13 ENCOUNTER — Encounter: Payer: Medicaid Other | Admitting: Obstetrics and Gynecology

## 2019-02-18 ENCOUNTER — Other Ambulatory Visit: Payer: Self-pay

## 2019-02-18 ENCOUNTER — Other Ambulatory Visit (INDEPENDENT_AMBULATORY_CARE_PROVIDER_SITE_OTHER): Payer: Medicaid Other | Admitting: *Deleted

## 2019-02-18 DIAGNOSIS — Z3483 Encounter for supervision of other normal pregnancy, third trimester: Secondary | ICD-10-CM

## 2019-02-18 DIAGNOSIS — Z23 Encounter for immunization: Secondary | ICD-10-CM | POA: Diagnosis not present

## 2019-02-18 DIAGNOSIS — Z3480 Encounter for supervision of other normal pregnancy, unspecified trimester: Secondary | ICD-10-CM

## 2019-02-18 NOTE — Progress Notes (Signed)
   Patient in clinic for 28 weeks labs and Tdap.  Clovis Pu, RN

## 2019-02-19 LAB — HIV ANTIBODY (ROUTINE TESTING W REFLEX): HIV Screen 4th Generation wRfx: NONREACTIVE

## 2019-02-19 LAB — CBC
Hematocrit: 36.2 % (ref 34.0–46.6)
Hemoglobin: 12 g/dL (ref 11.1–15.9)
MCH: 28.6 pg (ref 26.6–33.0)
MCHC: 33.1 g/dL (ref 31.5–35.7)
MCV: 86 fL (ref 79–97)
Platelets: 277 10*3/uL (ref 150–450)
RBC: 4.2 x10E6/uL (ref 3.77–5.28)
RDW: 12.3 % (ref 11.7–15.4)
WBC: 15.6 10*3/uL — ABNORMAL HIGH (ref 3.4–10.8)

## 2019-02-19 LAB — GLUCOSE TOLERANCE, 2 HOURS W/ 1HR
Glucose, 1 hour: 189 mg/dL — ABNORMAL HIGH (ref 65–179)
Glucose, 2 hour: 83 mg/dL (ref 65–152)
Glucose, Fasting: 84 mg/dL (ref 65–91)

## 2019-02-19 LAB — RPR: RPR Ser Ql: NONREACTIVE

## 2019-02-23 ENCOUNTER — Other Ambulatory Visit: Payer: Self-pay | Admitting: Obstetrics and Gynecology

## 2019-02-23 DIAGNOSIS — O24410 Gestational diabetes mellitus in pregnancy, diet controlled: Secondary | ICD-10-CM

## 2019-02-23 MED ORDER — ACCU-CHEK GUIDE W/DEVICE KIT: 1.0000 | PACK | Freq: Four times a day (QID) | 0 refills | Status: DC

## 2019-02-23 NOTE — Progress Notes (Signed)
Patient notified of GDMA1 diagnosis  Tammie Diaz, CNM

## 2019-02-27 ENCOUNTER — Other Ambulatory Visit: Payer: Medicaid Other

## 2019-03-04 ENCOUNTER — Encounter: Payer: Medicaid Other | Admitting: Registered"

## 2019-03-04 ENCOUNTER — Ambulatory Visit: Payer: Medicaid Other | Admitting: Registered"

## 2019-03-04 ENCOUNTER — Other Ambulatory Visit: Payer: Self-pay

## 2019-03-04 DIAGNOSIS — Z713 Dietary counseling and surveillance: Secondary | ICD-10-CM | POA: Diagnosis present

## 2019-03-04 DIAGNOSIS — O24410 Gestational diabetes mellitus in pregnancy, diet controlled: Secondary | ICD-10-CM

## 2019-03-04 DIAGNOSIS — Z3A00 Weeks of gestation of pregnancy not specified: Secondary | ICD-10-CM | POA: Diagnosis not present

## 2019-03-04 MED ORDER — ACCU-CHEK GUIDE VI STRP: ORAL_STRIP | 12 refills | Status: DC

## 2019-03-04 MED ORDER — ACCU-CHEK GUIDE W/DEVICE KIT: 1.0000 | PACK | Freq: Once | 0 refills | Status: AC

## 2019-03-04 MED ORDER — ACCU-CHEK SOFT TOUCH LANCETS MISC: 12 refills | Status: DC

## 2019-03-06 NOTE — Progress Notes (Signed)
Patient was seen on 03/04/19 for Gestational Diabetes self-management. EDD 05/07/19. Patient states no history of GDM. Diet history obtained. Patient eats mostly fast food, processed foods, limited intake of vegetables. Beverages include water, apple juice, sweet tea; before diagnosis was also drinking soda, but has stopped.  The following learning objectives were met by the patient :   States the definition of Gestational Diabetes  States why dietary management is important in controlling blood glucose  Describes the effects of carbohydrates on blood glucose levels  Demonstrates ability to create a balanced meal plan  Demonstrates carbohydrate counting   States when to check blood glucose levels  Demonstrates proper blood glucose monitoring techniques  States the effect of stress and exercise on blood glucose levels  States the importance of limiting caffeine and abstaining from alcohol and smoking  Plan:  Aim for 3 Carb Choices per meal (45 grams) +/- 1 either way  Aim for 1-2 Carbs per snack Begin reading food labels for Total Carbohydrate of foods If OK with your MD, consider  increasing your activity level by walking, Arm Chair Exercises or other activity daily as tolerated Begin checking BG before breakfast and 2 hours after first bite of breakfast, lunch and dinner as directed by MD  Patient was introduced to [employer] but may use log sheet to record BG Take medication if directed by MD  Blood glucose monitor given: none - demonstrated Accu-chek  Blood glucose monitor Rx called into pharmacy: Accu Check Guide with SoftClix lancets  Patient instructed to monitor glucose levels: FBS: 60 - 95 mg/dl 2 hour: <120 mg/dl  Patient received the following handouts:  Nutrition Diabetes and Pregnancy  Carbohydrate Counting List  BG Log Sheet  Patient will be seen for follow-up in as needed.

## 2019-03-11 ENCOUNTER — Other Ambulatory Visit: Payer: Self-pay | Admitting: *Deleted

## 2019-03-11 DIAGNOSIS — O24410 Gestational diabetes mellitus in pregnancy, diet controlled: Secondary | ICD-10-CM

## 2019-03-13 ENCOUNTER — Encounter: Payer: Medicaid Other | Admitting: Obstetrics and Gynecology

## 2019-03-19 ENCOUNTER — Ambulatory Visit (INDEPENDENT_AMBULATORY_CARE_PROVIDER_SITE_OTHER): Payer: Medicaid Other

## 2019-03-19 ENCOUNTER — Other Ambulatory Visit: Payer: Self-pay

## 2019-03-19 VITALS — BP 119/75 | HR 81 | Temp 98.1°F | Wt 160.8 lb

## 2019-03-19 DIAGNOSIS — Z3480 Encounter for supervision of other normal pregnancy, unspecified trimester: Secondary | ICD-10-CM

## 2019-03-19 DIAGNOSIS — O24410 Gestational diabetes mellitus in pregnancy, diet controlled: Secondary | ICD-10-CM

## 2019-03-19 DIAGNOSIS — Z98891 History of uterine scar from previous surgery: Secondary | ICD-10-CM

## 2019-03-19 DIAGNOSIS — Z3A33 33 weeks gestation of pregnancy: Secondary | ICD-10-CM

## 2019-03-19 DIAGNOSIS — O26843 Uterine size-date discrepancy, third trimester: Secondary | ICD-10-CM

## 2019-03-19 HISTORY — DX: Gestational diabetes mellitus in pregnancy, diet controlled: O24.410

## 2019-03-19 NOTE — Progress Notes (Signed)
   PRENATAL VISIT NOTE  Subjective:  Tammie Diaz is a 24 y.o. G2P1001 at [redacted]w[redacted]d who presents today for routine prenatal care.  She is currently being monitored for supervision of a low-risk pregnancy with problems as listed below.  Patient endorses fetal movement and denies abdominal cramping and contractions.  She does report some "stinging" in the area of her incision with fetal movement, but states it is only occasional.   She denies vaginal concerns including discharge, bleeding, leaking, itching, and burning. Patient desires a repeat cesarean section.  Patient has been taking her BS, but reports she ran out of lancets and missed a few days.   Patient Active Problem List   Diagnosis Date Noted  . Nausea and vomiting during pregnancy prior to [redacted] weeks gestation 10/30/2018  . History of preterm labor 10/30/2018  . History of low transverse cesarean section 10/30/2018  . Supervision of other normal pregnancy, antepartum 10/15/2018    The following portions of the patient's history were reviewed and updated as appropriate: allergies, current medications, past family history, past medical history, past social history, past surgical history and problem list. Problem list updated.  Objective:   Vitals:   03/19/19 1101  BP: 119/75  Pulse: 81  Temp: 98.1 F (36.7 C)  Weight: 160 lb 12.8 oz (72.9 kg)    Fetal Status: Fetal Heart Rate (bpm): 143 Fundal Height: 28 cm Movement: Present     General:  Alert, oriented and cooperative. Patient is in no acute distress.  Skin: Skin is warm and dry.   Cardiovascular: Regular rate and rhythm.  Respiratory: Normal respiratory effort. CTA-Bilaterally  Abdomen: Soft, gravid, appropriate for gestational age.  Pelvic: Cervical exam deferred        Extremities: Normal range of motion.  Edema: None  Mental Status: Normal mood and affect. Normal behavior. Normal judgment and thought content.   Assessment and Plan:  Pregnancy: G2P1001 at [redacted]w[redacted]d  1.  Supervision of other normal pregnancy, antepartum -Anticipatory guidance for upcoming appts.  -Educated on GBS bacteria including what it is, why we test, and how and when we treat if needed. -Discussed releasing of results to mychart. -Discussed and reviewed postpartum planning including contraception, pediatricians, and infant feedings and circumcision desires/costs. -Given information regarding circumcisions.   2. Diet controlled gestational diabetes mellitus (GDM) in third trimester -Reviewed log on glucometer. Ranges from 78-115 with one outlier of 133 at dinner. -Log incomplete and patient informed that regular testing of BS necessary.  Information sheet provided.  3. History of low transverse cesarean section -Desires repeat C/S. -Will schedule for pre-op visit, virtually, after next visit.   4. Uterine size-date discrepancy in third trimester -Reviewed fundal height measurement. -Questions regarding fetal size addressed.  -Informed that growth [place] ordered for assessment of fetal size.  Preterm labor symptoms and general obstetric precautions including but not limited to vaginal bleeding, contractions, leaking of fluid and fetal movement were reviewed with the patient.  Please refer to After Visit Summary for other counseling recommendations.  Return in about 3 weeks (around 04/09/2019).  Future Appointments  Date Time Provider Department Center  03/25/2019 10:15 AM [facility]  04/10/2019  2:10 PM Raelyn Mora, CNM [facility] None    Cherre Robins, CNM 03/19/2019, 11:14 AM

## 2019-03-19 NOTE — Patient Instructions (Addendum)
Places to have your son circumcised:                                                                      [facility]     [phone] while you are in hospital         [facility]              [phone]   $269 by 4 wks                      [facility]                     [phone]   $269 by 7 days [facility]                    [phone]   $269 by 4 wks [facility]             [phone]   $225 by 2 wks    These prices sometimes change but are roughly what you can expect to pay. Please call and confirm pricing.   Circumcision is considered an elective/non-medically necessary procedure. There are many reasons parents decide to have their sons circumsized. During the first year of life circumcised males have a reduced risk of urinary tract infections but after this year the rates between circumcised males and uncircumcised males are the same.  It is safe to have your son circumcised outside of the hospital and the places above perform them regularly.   Deciding about Circumcision in Baby Boys  (Up-to-date The Basics)  What is circumcision?   Circumcision is a surgery that removes the skin that covers the tip of the penis, called the "foreskin" Circumcision is usually done when a boy is between 75 and 66 days old. In the [place], circumcision is common. In some other countries, fewer boys are circumcised. Circumcision is a common tradition in some religions.  Should I have my baby boy circumcised?   There is no easy answer. Circumcision has some benefits. But it also has risks. After talking with your doctor, you will have to decide for yourself what is right for your family.  What are the benefits of circumcision?   Circumcised boys seem to have slightly lower rates of: ?Urinary tract infections ?Swelling of the opening at the tip of the penis Circumcised men seem to have slightly lower rates of: ?Urinary tract infections ?Swelling of the opening at the tip of the penis ?Penis cancer ?HIV  and other infections that you catch during sex ?Cervical cancer in the women they have sex with Even so, in the [place], the risks of these problems are small - even in boys and men who have not been circumcised. Plus, boys and men who are not circumcised can reduce these extra risks by: ?Cleaning their penis well ?Using condoms during sex  What are the risks of circumcision?  Risks include: ?Bleeding or infection from the surgery ?Damage to or amputation of the penis ?A chance that the doctor will cut off too much or not enough of the foreskin ?A chance that sex won't feel as good later in life Only about 1 out of every 200  circumcisions leads to problems. There is also a chance that your health insurance won't pay for circumcision.  How is circumcision done in baby boys?  First, the baby gets medicine for pain relief. This might be a cream on the skin or a shot into the base of the penis. Next, the doctor cleans the baby's penis well. Then he or she uses special tools to cut off the foreskin. Finally, the doctor wraps a bandage (called gauze) around the baby's penis. If you have your baby circumcised, his doctor or nurse will give you instructions on how to care for him after the surgery. It is important that you follow those instructions carefully.  Gestational Diabetes Mellitus, Self Care Caring for yourself after you have been diagnosed with gestational diabetes (gestational diabetes mellitus) means keeping your blood sugar (glucose) under control. You can do that with a balance of:  Nutrition.  Exercise.  Lifestyle changes.  Medicines or insulin, if necessary.  Support from your team of health care providers and others. The following information explains what you need to know to manage your gestational diabetes at home. What are the risks? If gestational diabetes is treated, it is unlikely to cause problems. If it is not controlled with treatment, it may cause problems  during labor and delivery, and some of those problems can be harmful to the unborn baby (fetus) and the mother. Uncontrolled gestational diabetes may also cause the newborn baby to have breathing problems and low blood glucose. Women who get gestational diabetes are more likely to develop it if they get pregnant again, and they are more likely to develop type 2 diabetes in the future. How to monitor blood glucose   Check your blood glucose every day during your pregnancy. Do this as often as told by your health care provider.  Contact your health care provider if your blood glucose is above your target for two tests in a row. Your health care provider will set individualized treatment goals for you. Generally, the goal of treatment is to maintain the following blood glucose levels during pregnancy:  Before meals (preprandial): at or below 95 mg/dL (5.3 mmol/L).  After meals (postprandial): ? One hour after a meal: at or below 140 mg/dL (7.8 mmol/L). ? Two hours after a meal: at or below 120 mg/dL (6.7 mmol/L).  A1c (hemoglobin A1c) level: 6-6.5%. How to manage hyperglycemia and hypoglycemia Hyperglycemia symptoms Hyperglycemia, also called high blood glucose, occurs when blood glucose is too high. Make sure you know the early signs of hyperglycemia, such as:  Increased thirst.  Hunger.  Feeling very tired.  Needing to urinate more often than usual.  Blurry vision. Hypoglycemia symptoms Hypoglycemia, also called low blood glucose, occurs with a blood glucose level at or below 70 mg/dL (3.9 mmol/L). The risk for hypoglycemia increases during or after exercise, during sleep, during illness, and when skipping meals or not eating for a long time (fasting). Symptoms may include:  Hunger.  Anxiety.  Sweating and feeling clammy.  Confusion.  Dizziness or feeling light-headed.  Sleepiness.  Nausea.  Increased heart rate.  Headache.  Blurry  vision.  Irritability.  Tingling or numbness around the mouth, lips, or tongue.  A change in coordination.  Restless sleep.  Fainting.  Seizure. It is important to know the symptoms of hypoglycemia and treat it right away. Always have a 15-gram rapid-acting carbohydrate snack with you to treat low blood glucose. Family members and close friends should also know the symptoms and should understand  how to treat hypoglycemia, in case you are not able to treat yourself. Treating hypoglycemia If you are alert and able to swallow safely, follow the 15:15 rule:  Take 15 grams of a rapid-acting carbohydrate. Talk with your health care provider about how much you should take.  Rapid-acting options include: ? Glucose pills (take 15 grams). ? 6-8 pieces of hard candy. ? 4-6 oz (120-150 mL) of fruit juice. ? 4-6 oz (120-150 mL) of regular (not diet) soda. ? 1 Tbsp (15 mL) honey or sugar.  Check your blood glucose 15 minutes after you take the carbohydrate.  If the repeat blood glucose level is still at or below 70 mg/dL (3.9 mmol/L), take 15 grams of a carbohydrate again.  If your blood glucose level does not increase above 70 mg/dL (3.9 mmol/L) after 3 tries, seek emergency medical care.  After your blood glucose level returns to normal, eat a meal or a snack within 1 hour. Treating severe hypoglycemia Severe hypoglycemia is when your blood glucose level is at or below 54 mg/dL (3 mmol/L). Severe hypoglycemia is an emergency. Do not wait to see if the symptoms will go away. Get medical help right away. Call your local emergency services (911 in the [place] If you have severe hypoglycemia and you cannot eat or drink, you may need an injection of glucagon. A family member or close friend should learn how to check your blood glucose and how to give you a glucagon injection. Ask your health care provider if you need to have an emergency glucagon injection kit available. Severe hypoglycemia may  need to be treated in a hospital. The treatment may include getting glucose through an IV. You may also need treatment for the cause of your hypoglycemia. Follow these instructions at home: Take diabetes medicines as told  If your health care provider prescribed insulin or diabetes medicines, take them every day.  Do not run out of insulin or other diabetes medicines that you take. Plan ahead so you always have these available.  If you use insulin, adjust your dosage based on how physically active you are and what foods you eat. Your health care provider will tell you how to adjust your dosage. Make healthy food choices  The things that you eat and drink affect your blood glucose (and your insulin dose, if this applies). Making good choices helps to control your diabetes and prevent other health problems. A healthy meal plan includes eating lean proteins, complex carbohydrates, fresh fruits and vegetables, low-fat dairy products, and healthy fats. Make an appointment to see a diet and nutrition specialist (registered dietitian) to help you create an eating plan that is right for you. Make sure that you:  Follow instructions from your health care provider about eating or drinking restrictions.  Drink enough fluid to keep your urine pale yellow.  Eat healthy snacks between nutritious meals.  Keep a record of the carbohydrates that you eat. Do this by reading food labels and learning the standard serving sizes of foods.  Follow your sick day plan whenever you cannot eat or drink as usual. Make this plan in advance with your health care provider.  Stay active  Do 30 or more minutes of physical activity a day, or as much physical activity as your health care provider recommends during your pregnancy. ? Doing 10 minutes of exercise starting 30 minutes after each meal may help to control postprandial blood glucose levels.  If you start a new exercise or activity, work with   your health care  provider to adjust your insulin, medicines, or food intake as needed. Make healthy lifestyle choices  Do not drink alcohol.  Do not use any tobacco products, such as cigarettes, chewing tobacco, and e-cigarettes. If you need help quitting, ask your health care provider.  Learn to manage stress. If you need help with this, ask your health care provider. Care for your body  Keep your immunizations up to date.  Brush your teeth and gums two times a day, and floss one or more times a day. Visit your dentist one or more times every 6 months.  Maintain a healthy weight during your pregnancy. General instructions  Take over-the-counter and prescription medicines only as told by your health care provider.  Talk with your health care provider about your risk for high blood pressure during pregnancy (preeclampsia or eclampsia).  Share your diabetes management plan with people in your workplace, school, and household.  Check your urine for ketones during your pregnancy when you are ill and as told by your health care provider.  Carry a medical alert card or wear medical alert jewelry that says you have gestational diabetes.  Keep all follow-up visits during your pregnancy (prenatal) and after delivery (postnatal) as told by your health care provider. This is important. Get the care that you need after delivery  Have your blood glucose level checked 4-12 weeks after delivery. This is done with an oral glucose tolerance test (OGTT).  Get screened for diabetes at least every 3 years, or as often as told by your health care provider. Questions to ask your health care provider  Do I need to meet with a diabetes educator?  Where can I find a support group for people with gestational diabetes? Where to find more information For more information about gestational diabetes, visit:  American Diabetes Association (ADA): www.diabetes.org  Centers for Disease Control and Prevention (CDC):  www.cdc.gov Summary  Check your blood glucose every day during your pregnancy. Do this as often as told by your health care provider.  If your health care provider prescribed insulin or diabetes medicines, take them every day as told.  Keep all follow-up visits during your pregnancy (prenatal) and after delivery (postnatal) as told by your health care provider. This is important.  Have your blood glucose level checked 4-12 weeks after delivery. This information is not intended to replace advice given to you by your health care provider. Make sure you discuss any questions you have with your health care provider. Document Revised: 07/02/2017 Document Reviewed: 02/12/2015 Elsevier Patient Education  2020 Elsevier Inc.  

## 2019-03-21 ENCOUNTER — Other Ambulatory Visit: Payer: Self-pay | Admitting: *Deleted

## 2019-03-21 DIAGNOSIS — O24410 Gestational diabetes mellitus in pregnancy, diet controlled: Secondary | ICD-10-CM

## 2019-03-25 ENCOUNTER — Other Ambulatory Visit: Payer: Medicaid Other

## 2019-03-26 ENCOUNTER — Encounter: Payer: Medicaid Other | Admitting: Advanced Practice Midwife

## 2019-03-26 ENCOUNTER — Other Ambulatory Visit: Payer: Self-pay

## 2019-03-27 ENCOUNTER — Encounter: Payer: Medicaid Other | Admitting: Registered"

## 2019-03-27 ENCOUNTER — Other Ambulatory Visit: Payer: Self-pay

## 2019-03-27 ENCOUNTER — Ambulatory Visit: Payer: Medicaid Other | Admitting: Registered"

## 2019-03-27 DIAGNOSIS — Z713 Dietary counseling and surveillance: Secondary | ICD-10-CM | POA: Diagnosis present

## 2019-03-27 DIAGNOSIS — O24410 Gestational diabetes mellitus in pregnancy, diet controlled: Secondary | ICD-10-CM | POA: Diagnosis not present

## 2019-03-27 DIAGNOSIS — Z3A00 Weeks of gestation of pregnancy not specified: Secondary | ICD-10-CM | POA: Insufficient documentation

## 2019-03-27 NOTE — Progress Notes (Signed)
Patient was seen on 03/27/19 for follow-up assessment and education for Gestational Diabetes. EDD 05/07/19. Patient states changes to diet/lifestyle including drinking water with drop-in flavor instead of sweet tea, has increased vegetable intake. Patient is following what her younger sister with T2DM is eating and seems to be working well. Pt states she is exercising 3x/week, goes to park with daughter who rides her bike.  Patient is testing blood glucose as directed pre breakfast and 2 hours after each meal. Patient is not using [employer]s, patient log book shows from recall states FBS is within range and post prandial readings have been in range with the exception of on her birthday was 136 mg/dL  The following learning objectives reviewed during follow-up visit:   Discussed importance of not restricting diet to extreme.  Discussed importance of exercise  Plan:  Continue eating more vegetables Can use splenda if you want to have sweet tea. Consider trying fruit & cinnamon in yogurt when having craving for the sweet yogurt found in the [place] store  Patient instructed to monitor glucose levels: FBS: 60 - 95 mg/dl 2 hour: <225 mg/dl  Patient received the following handouts:  none  Patient will be seen for follow-up in as needed.

## 2019-04-01 ENCOUNTER — Ambulatory Visit (HOSPITAL_COMMUNITY)
Admission: RE | Admit: 2019-04-01 | Discharge: 2019-04-01 | Disposition: A | Discharge status: Home / Self Care | Payer: Medicaid Other | Source: Ambulatory Visit | Attending: Obstetrics and Gynecology | Admitting: Obstetrics and Gynecology

## 2019-04-01 ENCOUNTER — Other Ambulatory Visit: Payer: Self-pay

## 2019-04-01 DIAGNOSIS — O24410 Gestational diabetes mellitus in pregnancy, diet controlled: Secondary | ICD-10-CM | POA: Insufficient documentation

## 2019-04-01 DIAGNOSIS — Z3480 Encounter for supervision of other normal pregnancy, unspecified trimester: Secondary | ICD-10-CM

## 2019-04-01 DIAGNOSIS — O26843 Uterine size-date discrepancy, third trimester: Secondary | ICD-10-CM

## 2019-04-01 DIAGNOSIS — Z362 Encounter for other antenatal screening follow-up: Secondary | ICD-10-CM

## 2019-04-01 DIAGNOSIS — Z3A34 34 weeks gestation of pregnancy: Secondary | ICD-10-CM

## 2019-04-02 ENCOUNTER — Ambulatory Visit (HOSPITAL_COMMUNITY): Payer: Medicaid Other

## 2019-04-04 ENCOUNTER — Other Ambulatory Visit: Payer: Self-pay

## 2019-04-09 ENCOUNTER — Encounter: Payer: Self-pay | Admitting: Family Medicine

## 2019-04-09 ENCOUNTER — Telehealth (INDEPENDENT_AMBULATORY_CARE_PROVIDER_SITE_OTHER): Payer: Medicaid Other | Admitting: Family Medicine

## 2019-04-09 DIAGNOSIS — Z98891 History of uterine scar from previous surgery: Secondary | ICD-10-CM

## 2019-04-09 DIAGNOSIS — Z3480 Encounter for supervision of other normal pregnancy, unspecified trimester: Secondary | ICD-10-CM

## 2019-04-09 NOTE — Progress Notes (Signed)
128

## 2019-04-09 NOTE — Progress Notes (Signed)
OBSTETRICS PRENATAL VIRTUAL VISIT ENCOUNTER NOTE  Provider location: [facility] [place]   I connected with Tammie Diaz on 04/09/19 at  3:55 PM EDT by MyChart Video Encounter at home and verified that I am speaking with the correct person using two identifiers.   I discussed the limitations, risks, security and privacy concerns of performing an evaluation and management service virtually and the availability of in person appointments. I also discussed with the patient that there may be a patient responsible charge related to this service. The patient expressed understanding and agreed to proceed. Subjective:  Tammie Diaz is a 24 y.o. G2P1001 at [redacted]w[redacted]d being seen today for ongoing prenatal care.  She is currently monitored for the following issues for this low-risk pregnancy and has Supervision of other normal pregnancy, antepartum; Nausea and vomiting during pregnancy prior to [redacted] weeks gestation; History of preterm labor; History of low transverse cesarean section; and Diet controlled gestational diabetes mellitus (GDM) in third trimester on their problem list.  Patient reports no complaints.   .  .  Movement: Present. Denies any leaking of fluid.   The following portions of the patient's history were reviewed and updated as appropriate: allergies, current medications, past family history, past medical history, past social history, past surgical history and problem list.   Objective:   Vitals:   04/09/19 1553  BP: 128/79  Pulse: 78    Fetal Status:     Movement: Present     General:  Alert, oriented and cooperative. Patient is in no acute distress.  Respiratory: Normal respiratory effort, no problems with respiration noted  Mental Status: Normal mood and affect. Normal behavior. Normal judgment and thought content.  Rest of physical exam deferred due to type of encounter  Imaging: [place] MFM OB FOLLOW UP  Result Date:  04/01/2019 ----------------------------------------------------------------------  OBSTETRICS REPORT                       (Signed Final 04/01/2019 11:06 am) ---------------------------------------------------------------------- Patient Info  ID #:       [id number]                          D.O.B.:  1996-01-21 (24 yrs)  Name:       Tammie Diaz                  Visit Date: 04/01/2019 07:43 am ---------------------------------------------------------------------- Performed By  Performed By:     Marcellina Millin          Ref. Address:     Faculty Practice                    RDMS  Attending:        Lin Landsman      [facility]                                       [facility]  Referred By:      Gerrit Heck                    CNM ---------------------------------------------------------------------- Orders   #  Description  Code         Ordered By   1  [place] MFM OB FOLLOW UP                  [id number]     JESSICA EMLY  ----------------------------------------------------------------------   #  Order #                    Accession #                 Episode #   1  [id number]                  [id number]                  [id number]  ---------------------------------------------------------------------- Indications   Gestational diabetes in pregnancy, diet        O24.410   controlled   [redacted] weeks gestation of pregnancy                Z3A.34   Antenatal follow-up for nonvisualized fetal    Z36.2   anatomy   Uterine size-date discrepancy, third trimester O26.843   Previous cesarean delivery, antepartum         O34.219  ---------------------------------------------------------------------- Fetal Evaluation  Num Of Fetuses:         1  Fetal Heart Rate(bpm):  133  Cardiac Activity:       Observed  Presentation:           Cephalic  Placenta:               Anterior  P. Cord Insertion:      Previously Visualized  Amniotic Fluid  AFI FV:      Within normal limits  AFI  Sum(cm)     %Tile       Largest Pocket(cm)  15.52           56          6.05  RUQ(cm)       RLQ(cm)       LUQ(cm)        LLQ(cm)  6.05          4.09          1.82           3.56 ---------------------------------------------------------------------- Biometry  BPD:      86.9  mm     G. Age:  35w 1d         58  %    CI:        75.77   %    70 - 86                                                          FL/HC:      20.3   %    20.1 - 22.3  HC:      316.5  mm     G. Age:  35w 4d         32  %    HC/AC:      1.02        0.93 - 1.11  AC:      309.1  mm     G. Age:  34w 6d  56  %    FL/BPD:     73.9   %    71 - 87  FL:       64.2  mm     G. Age:  33w 1d          8  %    FL/AC:      20.8   %    20 - 24  Est. FW:    2442  gm      5 lb 6 oz     35  % ---------------------------------------------------------------------- OB History  Gravidity:    2         Term:   1        Prem:   0        SAB:   0  TOP:          0       Ectopic:  0        Living: 1 ---------------------------------------------------------------------- Gestational Age  U/S Today:     34w 5d                                        EDD:   05/08/19  Best:          34w 6d     Det. ByMarcella Dubs         EDD:   05/07/19                                      (09/29/18) ---------------------------------------------------------------------- Anatomy  Cranium:               Appears normal         Aortic Arch:            Previously seen  Cavum:                 Appears normal         Ductal Arch:            Previously seen  Ventricles:            Appears normal         Diaphragm:              Appears normal  Choroid Plexus:        Previously seen        Stomach:                Appears normal, left                                                                        sided  Cerebellum:            Previously seen        Abdomen:                Appears normal  Posterior Fossa:       Previously seen        Abdominal Wall:  Previously seen  Nuchal Fold:            Previously seen        Cord Vessels:           Previously seen  Face:                  Orbits and profile     Kidneys:                Appear normal                         previously seen  Lips:                  Previously seen        Bladder:                Appears normal  Thoracic:              Appears normal         Spine:                  Previously seen  Heart:                 Previously seen        Upper Extremities:      Previously seen  RVOT:                  Appears normal         Lower Extremities:      Previously seen  LVOT:                  Appears normal  Other:  Fetus appears to be a female. Heels previously visualized. Right 5th          previously visualized. Nasal bone previously visualized. ---------------------------------------------------------------------- Cervix Uterus Adnexa  Cervix  Not visualized (advanced GA >24wks) ---------------------------------------------------------------------- Impression  Follow up growth for size and date discrepancy  A1GDM  Good fetal movement and amniotic fluid ---------------------------------------------------------------------- Recommendations  Follow up as clinically or if Ms. Conley requires therapy  please initiate weekly testing and repeat growth in 4 week  from today. ----------------------------------------------------------------------               Lin Landsmanorenthian Booker, MD Electronically Signed Final Report   04/01/2019 11:06 am ----------------------------------------------------------------------   Assessment and Plan:  Pregnancy: G2P1001 at 5040w0d 1. Supervision of other normal pregnancy, antepartum Continue routine prenatal care.   2. History of low transverse cesarean section Desires RCS with BTL--needs to sign papers, likely will not e 6030 days old, and should not have abdomen open and not tie tubes if desires. She will consider and discuss with primary OB tomorrow. Risks include but are not limited to bleeding, infection, injury to  surrounding structures, including bowel, bladder and ureters, blood clots, and death.  Likelihood of success is high. Discussed if wants more kids, could consider TOLAC, though in depth discussion about risks of this not explored due to patient desires for RCS--risks of multiple RCS discussed.  Preterm labor symptoms and general obstetric precautions including but not limited to vaginal bleeding, contractions, leaking of fluid and fetal movement were reviewed in detail with the patient. I discussed the assessment and treatment plan with the patient. The patient was provided an opportunity to ask questions and all were answered. The patient agreed with the plan and demonstrated an understanding of the instructions. The patient  was advised to call back or seek an in-person office evaluation/go to [facility] at [facility] for any urgent or concerning symptoms. Please refer to After Visit Summary for other counseling recommendations.   I provided 8 minutes of face-to-face time during this encounter.  No follow-ups on file.  Future Appointments  Date Time Provider Department Center  04/10/2019  8:10 AM Raelyn Mora, CNM [facility] None    Reva Bores, MD [facility]

## 2019-04-09 NOTE — Patient Instructions (Addendum)
Please check your sugars at least 4 times per day (fasting and 2 hours after each meal) and record each number every day. PLEASE SEND YOUR NUMBERS THROUGH MY CHART AND BABYSCRIPTS TODAY!!!!  I fyou desire to have a repeat c-section, you will need to have a virtual visit with one of our doctors so they can discuss that procedure with you and answer any questions you may have. That will be scheduled for you as soon as possible.  Gestational Diabetes Mellitus, Diagnosis Gestational diabetes (gestational diabetes mellitus) is a short-term (temporary) form of diabetes that can happen during pregnancy. It goes away after you give birth. It may be caused by one or both of these problems:  Your pancreas does not make enough of a hormone called insulin.  Your body does not respond in a normal way to insulin that it makes. Insulin lets sugars (glucose) go into cells in the body. This gives you energy. If you have diabetes, sugars cannot get into cells. This causes high blood sugar (hyperglycemia). If you get gestational diabetes, you are:  More likely to get it if you get pregnant again.  More likely to develop type 2 diabetes in the future. If gestational diabetes is treated, it may not hurt you or your baby. Your doctor will set treatment goals for you. In general, you should have these blood sugar levels:  After not eating for a long time (fasting): 95 mg/dL (5.3 mmol/L).  After meals (postprandial): ? One hour after a meal: at or below 140 mg/dL (7.8 mmol/L). ? Two hours after a meal: at or below 120 mg/dL (6.7 mmol/L).  A1c (hemoglobin A1c) level: 6-6.5%. Follow these instructions at home: Questions to ask your doctor   You may want to ask these questions: ? Do I need to meet with a diabetes educator? ? What equipment will I need to care for myself at home? ? What medicines do I need? When should I take them? ? How often do I need to check my blood sugar? ? What number can I call if I  have questions? ? When is my next doctor's visit? General instructions  Take over-the-counter and prescription medicines only as told by your doctor.  Stay at a healthy weight during pregnancy.  Keep all follow-up visits as told by your doctor. This is important. Contact a doctor if:  Your blood sugar is at or above 240 mg/dL (20.2 mmol/L).  Your blood sugar is at or above 200 mg/dL (54.2 mmol/L) and you have ketones in your pee (urine).  You have been sick or have had a fever for 2 days or more and you are not getting better.  You have any of these problems for more than 6 hours: ? You cannot eat or drink. ? You feel sick to your stomach (nauseous). ? You throw up (vomit). ? You have watery poop (diarrhea). Get help right away if:  Your blood sugar is lower than 54 mg/dL (3 mmol/L).  You get confused.  You have trouble: ? Thinking clearly. ? Breathing.  Your baby moves less than normal.  You have any of these: ? Moderate or large ketone levels in your pee. ? Blood coming from your vagina. ? Unusual fluid coming from your vagina. ? Early contractions. These may feel like tightness in your belly. Summary  Gestational diabetes is a short-term form of diabetes. It can happen while you are pregnant. It goes away after you give birth.  If gestational diabetes is treated, it  may not hurt you or your baby. Your doctor will set treatment goals for you.  Keep all follow-up visits as told by your doctor. This is important. This information is not intended to replace advice given to you by your health care provider. Make sure you discuss any questions you have with your health care provider. Document Revised: 02/15/2017 Document Reviewed: 02/12/2015 Elsevier Patient Education  2020 [employer].

## 2019-04-09 NOTE — Progress Notes (Signed)
   LOW-RISK PREGNANCY OFFICE VISIT Patient name: Tammie Diaz MRN [id number]  Date of birth: Dec 17, 1995 Chief Complaint:   Routine Prenatal Visit  History of Present Illness:   Tammie Diaz is a 24 y.o. G64P1001 female at [redacted]w[redacted]d with an Estimated Date of Delivery: 05/07/19 being seen today for ongoing management of a low-risk pregnancy.  Today she reports no complaints. Contractions: Not present. Vag. Bleeding: None.  Movement: Present. denies leaking of fluid. Review of Systems:   Pertinent items are noted in HPI Denies abnormal vaginal discharge w/ itching/odor/irritation, headaches, visual changes, shortness of breath, chest pain, abdominal pain, severe nausea/vomiting, or problems with urination or bowel movements unless otherwise stated above. Pertinent History Reviewed:  Reviewed past medical,surgical, social, obstetrical and family history.  Reviewed problem list, medications and allergies. Physical Assessment:   Vitals:   04/10/19 0806  BP: 119/78  Pulse: 87  Temp: 97.9 F (36.6 C)  Weight: 168 lb 12.8 oz (76.6 kg)  Body mass index is 30.38 kg/m.        Physical Examination:   General appearance: Well appearing, and in no distress  Mental status: Alert, oriented to person, place, and time  Skin: Warm & dry  Cardiovascular: Normal heart rate noted  Respiratory: Normal respiratory effort, no distress  Abdomen: Soft, gravid, nontender  Pelvic: Cervical exam performed  Dilation: Closed Effacement (%): Thick Station: Ballotable  Extremities: Edema: None  Fetal Status: Fetal Heart Rate (bpm): 136 Fundal Height: 35 cm Movement: Present Presentation: Vertex  No results found for this or any previous visit (from the past 24 hour(s)).  Assessment & Plan:  1) Low-risk pregnancy G2P1001 at [redacted]w[redacted]d with an Estimated Date of Delivery: 05/07/19   2) Supervision of other normal pregnancy, antepartum  - Culture, beta strep (group b only),  - Cervicovaginal ancillary only( [facility]) - Decided on Nexplanon as family planning after pregnancy. Informed that Nexplanon could be inserted before she is discharged from hospital - Anticipatory guidance given for upcoming appts. My Chart visit at 37 wks and in-office at 38 wks  3) History of low transverse cesarean section - Planning RCS - Virtual visit with Dr. Shawnie Pons on 04/09/2019 - Declines TOLAC - Will not have BTL  4) Diet controlled gestational diabetes mellitus (GDM), antepartum - No log at visit today. BS "normal" - Advised to send BS numbers via My Chart and through Babyscripts today. Patient agrees.   Meds: No orders of the defined types were placed in this encounter.  Labs/procedures today: GBS, GC/CT  Plan:  Continue routine obstetrical care   Reviewed: Preterm labor symptoms and general obstetric precautions including but not limited to vaginal bleeding, contractions, leaking of fluid and fetal movement were reviewed in detail with the patient.  All questions were answered. Has home bp cuff. Check bp weekly, let us know if >140/90.   Follow-up: Return in about 1 week (around 04/17/2019) for Return OB - My Chart video then in-office at 38 wks.  Orders Placed This Encounter  Procedures  . Culture, beta strep (group b only)   Raelyn Mora MSN, CNM 04/10/2019 8:33 AM

## 2019-04-10 ENCOUNTER — Ambulatory Visit (INDEPENDENT_AMBULATORY_CARE_PROVIDER_SITE_OTHER): Payer: Medicaid Other | Admitting: Obstetrics and Gynecology

## 2019-04-10 ENCOUNTER — Encounter: Payer: Self-pay | Admitting: Obstetrics and Gynecology

## 2019-04-10 ENCOUNTER — Other Ambulatory Visit (HOSPITAL_COMMUNITY)
Admission: RE | Admit: 2019-04-10 | Discharge: 2019-04-10 | Disposition: A | Discharge status: Home / Self Care | Payer: Medicaid Other | Source: Ambulatory Visit | Attending: Obstetrics and Gynecology | Admitting: Obstetrics and Gynecology

## 2019-04-10 ENCOUNTER — Other Ambulatory Visit: Payer: Self-pay

## 2019-04-10 VITALS — BP 119/78 | HR 87 | Temp 97.9°F | Wt 168.8 lb

## 2019-04-10 DIAGNOSIS — Z3480 Encounter for supervision of other normal pregnancy, unspecified trimester: Secondary | ICD-10-CM | POA: Diagnosis present

## 2019-04-10 DIAGNOSIS — O24410 Gestational diabetes mellitus in pregnancy, diet controlled: Secondary | ICD-10-CM

## 2019-04-10 DIAGNOSIS — Z98891 History of uterine scar from previous surgery: Secondary | ICD-10-CM

## 2019-04-10 DIAGNOSIS — Z3A36 36 weeks gestation of pregnancy: Secondary | ICD-10-CM

## 2019-04-11 LAB — CERVICOVAGINAL ANCILLARY ONLY
Bacterial Vaginitis (gardnerella): NEGATIVE
Candida Glabrata: NEGATIVE
Candida Vaginitis: NEGATIVE
Chlamydia: NEGATIVE
Comment: NEGATIVE
Comment: NEGATIVE
Comment: NEGATIVE
Comment: NEGATIVE
Comment: NEGATIVE
Comment: NORMAL
Neisseria Gonorrhea: NEGATIVE
Trichomonas: NEGATIVE

## 2019-04-13 IMAGING — CT CT HEAD W/O CM
4 series · 17 of 47 positions shown, 19 images · non-contrast
Comparison: None.

CLINICAL DATA: Patient with headache after trauma to the head.
Blurred vision.

EXAM:
CT HEAD WITHOUT CONTRAST
TECHNIQUE: Contiguous axial images were obtained from the base of the skull
through the vertex without intravenous contrast.

[Series 3: head without · axial · non-contrast · 0.39mm/px · z∈[-55,+60]mm · 7 of 31 slices shown, 9 images]
[im 4/31  brain]
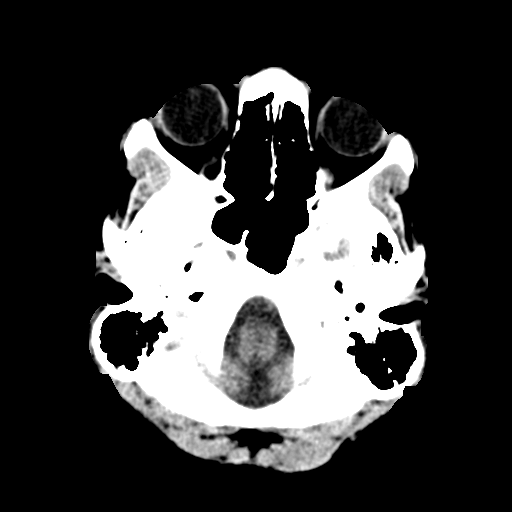
[im 4/31  bone]
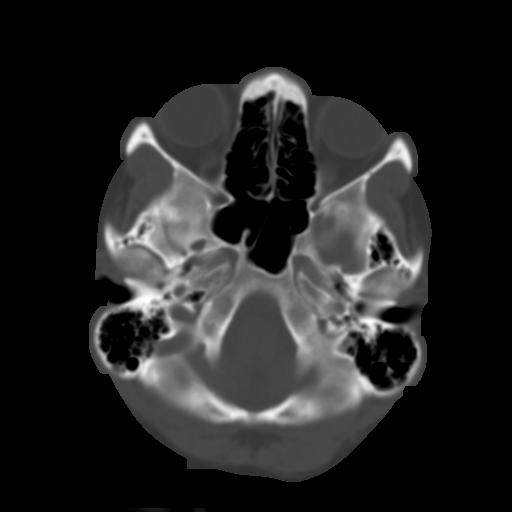
[im 8/31  brain]
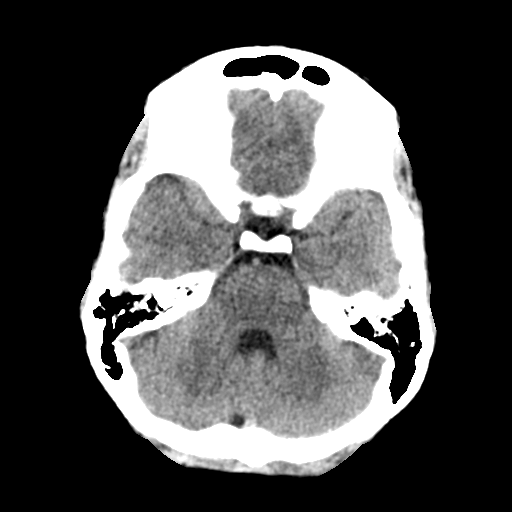
[im 12/31  brain]
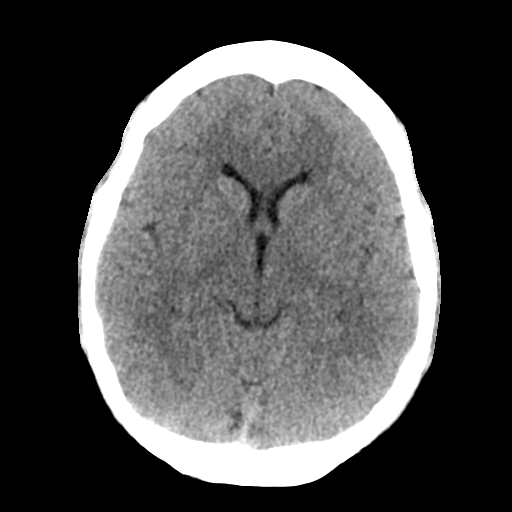
[im 16/31  brain]
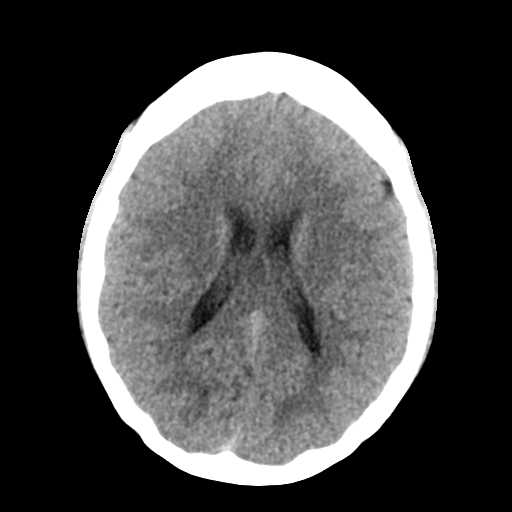
[im 19/31  brain]
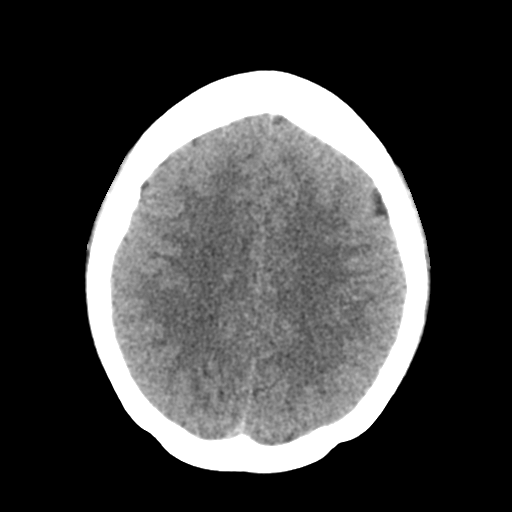
[im 19/31  bone]
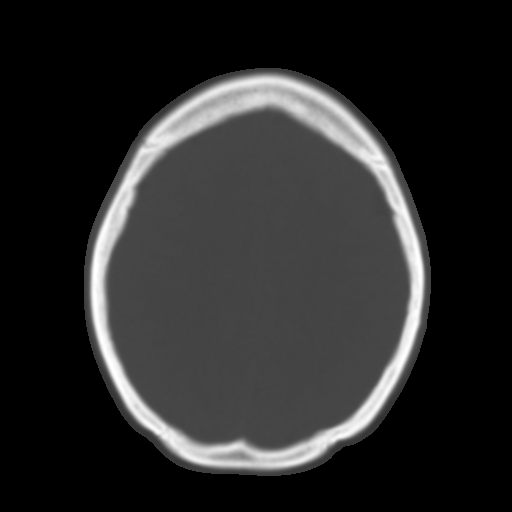
[im 23/31  brain]
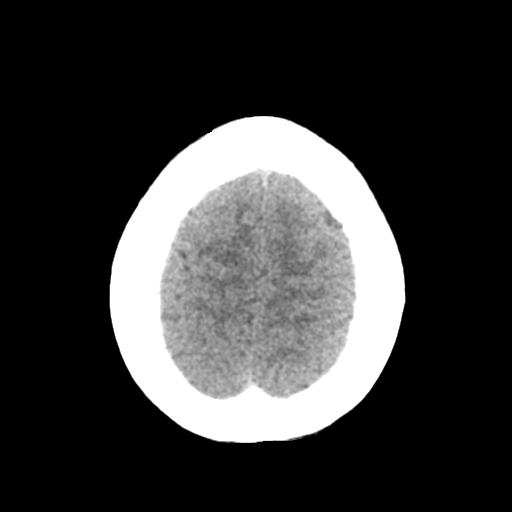
[im 27/31  brain]
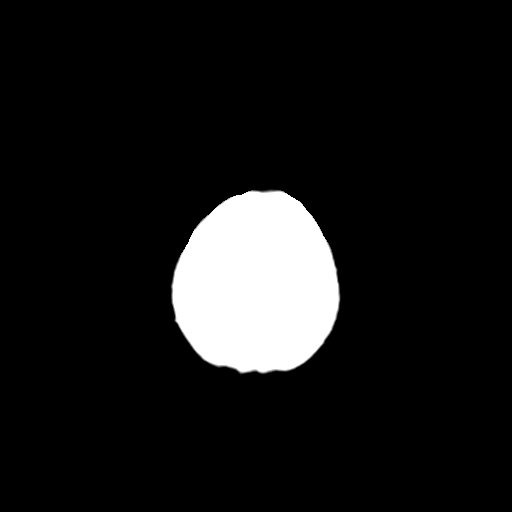

[Series 4: head bone · axial · 0.39mm/px · z∈[-56,-2]mm · 4 of 77 slices shown]
[im 8/77  bone]
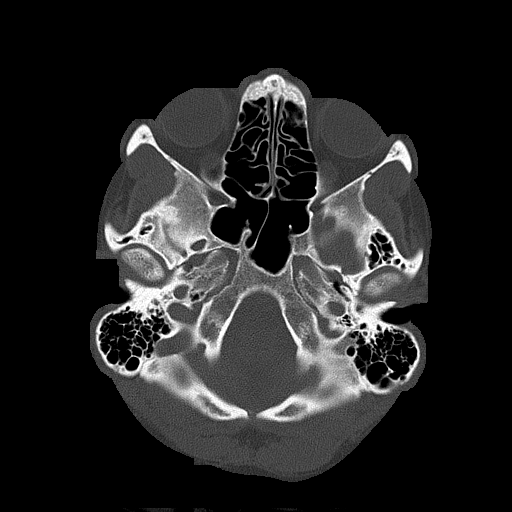
[im 16/77  bone]
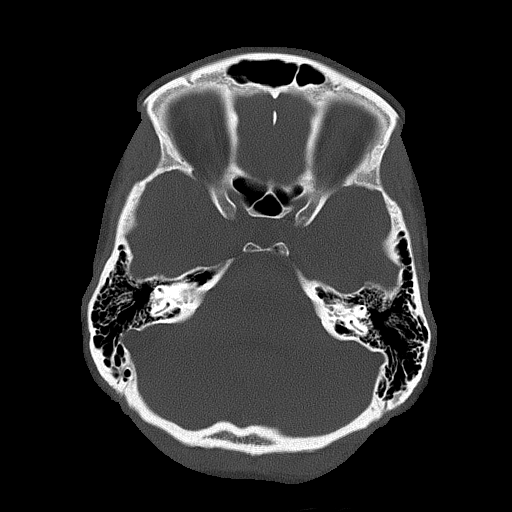
[im 23/77  bone]
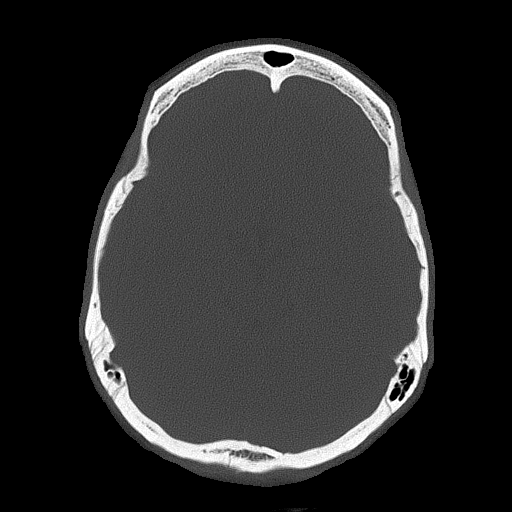
[im 35/77  bone]
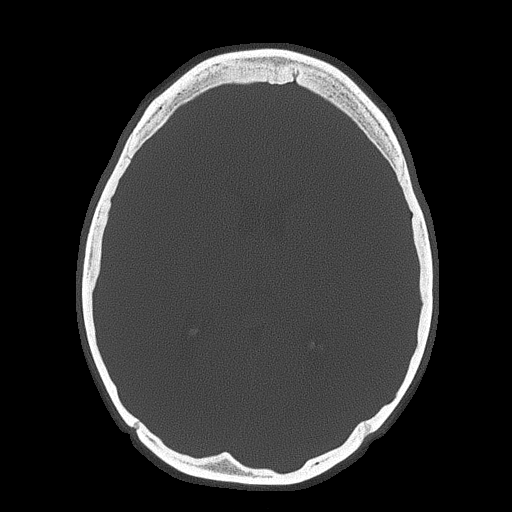

[Series 5: head without cor · coronal · non-contrast · 0.30mm/px · 3 of 63 slices shown]
[im 21/63  brain]
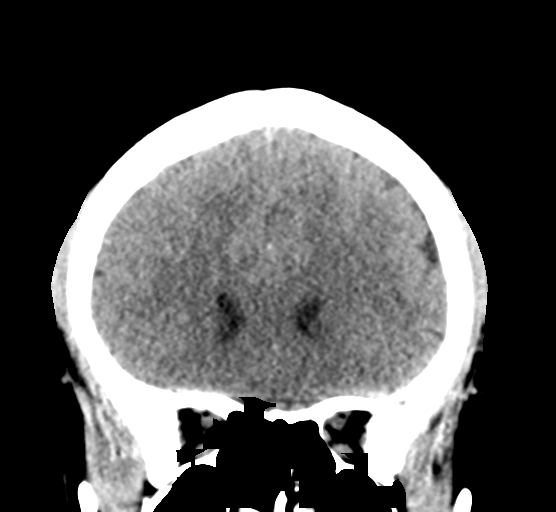
[im 28/63  brain]
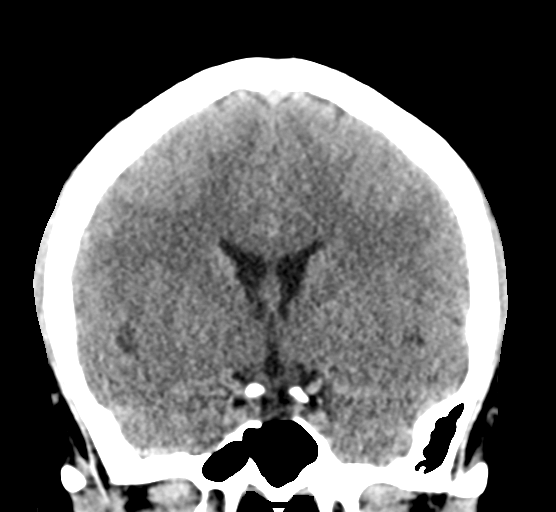
[im 35/63  brain]
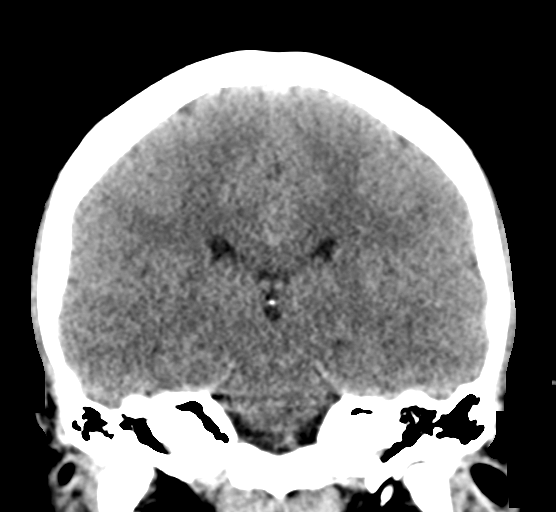

[Series 6: head without sag · sagittal · non-contrast · 0.30mm/px · 3 of 52 slices shown]
[im 18/52  brain]
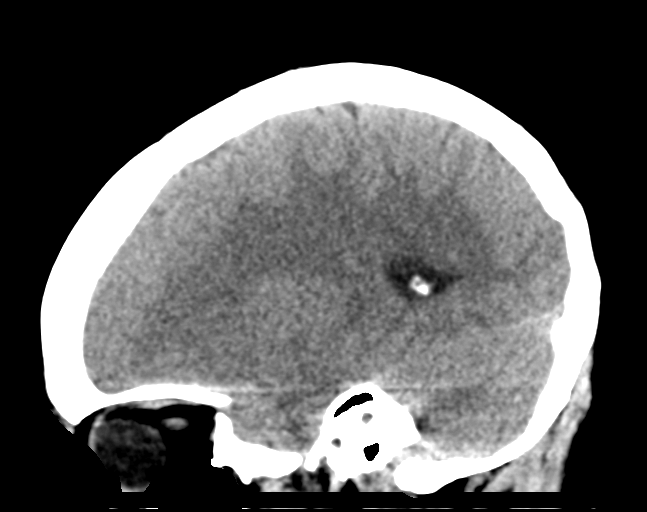
[im 26/52  brain]
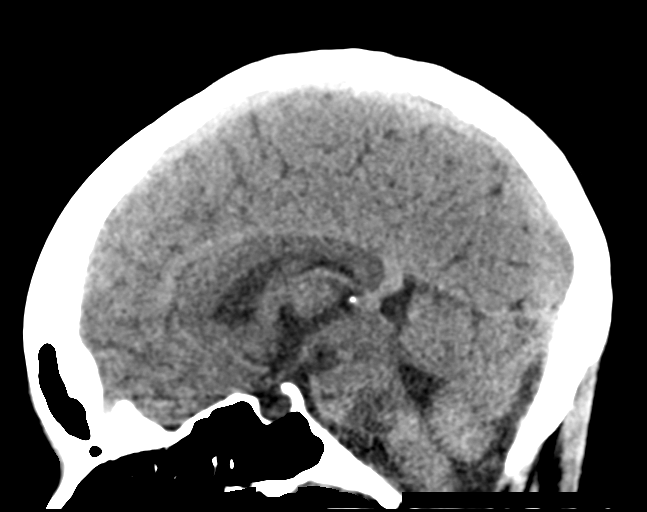
[im 35/52  brain]
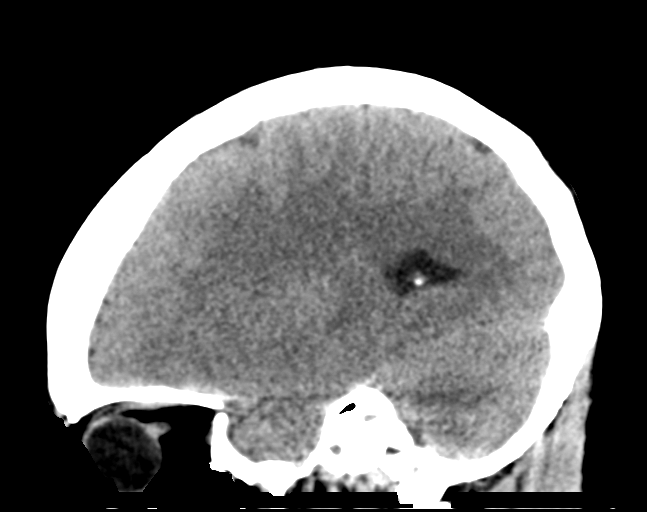

[17 of 47 positions shown; findings below may reference images not displayed]

FINDINGS: Brain: No evidence of acute infarction, hemorrhage, hydrocephalus,
extra-axial collection or mass lesion/mass effect.

Vascular: No hyperdense vessel or unexpected calcification.

Skull: Normal. Negative for fracture or focal lesion.

Sinuses/Orbits: No acute finding.

Other: None.
IMPRESSION: No acute intracranial process.

## 2019-04-14 ENCOUNTER — Other Ambulatory Visit: Payer: Self-pay | Admitting: Obstetrics and Gynecology

## 2019-04-14 LAB — CULTURE, BETA STREP (GROUP B ONLY): Strep Gp B Culture: NEGATIVE

## 2019-04-16 NOTE — Patient Instructions (Signed)
Tammie Diaz  04/16/2019   Your procedure is scheduled on:  04/30/2019  Arrive at 0745 at Entrance C on [address] at [facility]  and [address]. You are invited to use the FREE valet parking or use the Visitor's parking deck.  Pick up the phone at the desk and dial [phone].  Call this number if you have problems the morning of surgery: [phone]  Remember:   Do not eat food:(After Midnight) Desps de medianoche.  Do not drink clear liquids: (After Midnight) Desps de medianoche.  Take these medicines the morning of surgery with A SIP OF WATER:  none   Do not wear jewelry, make-up or nail polish.  Do not wear lotions, powders, or perfumes. Do not wear deodorant.  Do not shave 48 hours prior to surgery.  Do not bring valuables to the hospital.  [facility] is not   responsible for any belongings or valuables brought to the hospital.  Contacts, dentures or bridgework may not be worn into surgery.  Leave suitcase in the car. After surgery it may be brought to your room.  For patients admitted to the hospital, checkout time is 11:00 AM the day of              discharge.      Please read over the following fact sheets that you were given:     Preparing for Surgery

## 2019-04-17 ENCOUNTER — Encounter (HOSPITAL_COMMUNITY): Payer: Self-pay

## 2019-04-18 ENCOUNTER — Telehealth (INDEPENDENT_AMBULATORY_CARE_PROVIDER_SITE_OTHER): Payer: Medicaid Other

## 2019-04-18 VITALS — BP 126/76 | HR 78

## 2019-04-18 DIAGNOSIS — O24410 Gestational diabetes mellitus in pregnancy, diet controlled: Secondary | ICD-10-CM

## 2019-04-18 DIAGNOSIS — Z3A37 37 weeks gestation of pregnancy: Secondary | ICD-10-CM

## 2019-04-18 DIAGNOSIS — Z3480 Encounter for supervision of other normal pregnancy, unspecified trimester: Secondary | ICD-10-CM

## 2019-04-18 NOTE — Progress Notes (Signed)
I connected with@ on 04/18/19 at  8:10 AM EDT by: Mychart  and verified that I am speaking with the correct person using two identifiers.  Patient is located at home and provider is located at [facility].     The purpose of this virtual visit is to provide medical care while limiting exposure to the novel coronavirus. I discussed the limitations, risks, security and privacy concerns of performing an evaluation and management service by mychart video and the availability of in person appointments. I also discussed with the patient that there may be a patient responsible charge related to this service. By engaging in this virtual visit, you consent to the provision of healthcare.  Additionally, you authorize for your insurance to be billed for the services provided during this visit.  The patient expressed understanding and agreed to proceed.  The following staff members participated in the virtual visit:  J.Pernell Lenoir, CNM    PRENATAL VISIT NOTE  Subjective:  Tammie Diaz is a 24 y.o. G2P1001 at [redacted]w[redacted]d  for phone visit for ongoing prenatal care.  She is currently monitored for the following issues for this high-risk pregnancy and has Supervision of other normal pregnancy, antepartum; Nausea and vomiting during pregnancy prior to [redacted] weeks gestation; History of preterm labor; History of low transverse cesarean section; and Diet controlled gestational diabetes mellitus (GDM) in third trimester on their problem list.  Patient reports no complaints.  Contractions: Not present. Vag. Bleeding: None.  Movement: Present. Denies leaking of fluid or vaginal bleeding. She questions if she can bring someone with her for her scheduled C/S.    The following portions of the patient's history were reviewed and updated as appropriate: allergies, current medications, past family history, past medical history, past social history, past surgical history and problem list.   Objective:   Vitals:   04/18/19 0808  BP:  126/76  Pulse: 78   Self-Obtained  Fetal Status:     Movement: Present     Assessment and Plan:  Pregnancy: G2P1001 at [redacted]w[redacted]d 1. Supervision of other normal pregnancy, antepartum -Anticipatory guidance for upcoming appts. -Confirms Repeat C/S scheduled for April 7th. -Reassured that she can have one support person accompany her during C/S and hospital stay.  -Discussed obtaining Covid test and need for self-quarantine prior to C/S.  -Desires Nexplanon and informed it would be placed prior to hospital discharge.  2. Diet controlled gestational diabetes mellitus (GDM) in third trimester -Reports BS have been in normal range. -Fasting yesterday was 92, Dinner was 112 -Instructed to continue to monitor and bring log/glucometer at next in person visit.   Term labor symptoms and general obstetric precautions including but not limited to vaginal bleeding, contractions, leaking of fluid and fetal movement were reviewed in detail with the patient.  Return in about 1 week (around 04/25/2019) for HR-ROB via VV.  Future Appointments  Date Time Provider Department Center  04/24/2019  8:10 AM Raelyn Mora, CNM [facility] None  04/28/2019  8:30 AM [facility] None  06/12/2019  1:10 PM Raelyn Mora, CNM [facility] None     Time spent on virtual visit: 5 minutes  Cherre Robins, CNM

## 2019-04-20 NOTE — Progress Notes (Signed)
HIGH-RISK PREGNANCY OFFICE VISIT Patient name: Tammie Diaz MRN [id number]  Date of birth: 1995-08-04 Chief Complaint:   Routine Prenatal Visit  History of Present Illness:   Tammie Diaz is a 24 y.o. G5P1001 female at [redacted]w[redacted]d with an Estimated Date of Delivery: 05/07/19 being seen today for ongoing management of a high-risk pregnancy complicated by A1DM Today she reports increased pelvic pressure and "baby's movements are less at night, but when I turn on my side he starts moving more. Is that normal?". Contractions: Not present. Vag. Bleeding: None.  Movement: Present. denies leaking of fluid.  Review of Systems:   Pertinent items are noted in HPI Denies abnormal vaginal discharge w/ itching/odor/irritation, headaches, visual changes, shortness of breath, chest pain, abdominal pain, severe nausea/vomiting, or problems with urination or bowel movements unless otherwise stated above. Pertinent History Reviewed:  Reviewed past medical,surgical, social, obstetrical and family history.  Reviewed problem list, medications and allergies. Physical Assessment:   Vitals:   04/24/19 0807  BP: 122/79  Pulse: 85  Temp: (!) 97.4 F (36.3 C)  Weight: 172 lb 3.2 oz (78.1 kg)  Body mass index is 31.5 kg/m.           Physical Examination:   General appearance: alert, well appearing, and in no distress  Mental status: alert, oriented to person, place, and time, normal mood, behavior, speech, dress, motor activity, and thought processes  Skin: warm & dry   Extremities: Edema: None    Cardiovascular: normal heart rate noted  Respiratory: normal respiratory effort, no distress  Abdomen: gravid, soft, non-tender  Pelvic: Cervical exam deferred         Fetal Status: Fetal Heart Rate (bpm): 130 Fundal Height: 39 cm Movement: Present    Fetal Surveillance Testing today: none   No results found for this or any previous visit (from the past 24 hour(s)).  Assessment & Plan:  1) High-risk pregnancy  G2P1001 at [redacted]w[redacted]d with an Estimated Date of Delivery: 05/07/19   2) Supervision of other normal pregnancy, antepartum - Reassurance given that fetal movements seem slower during baby's sleep cycles. It is normal for baby to start moving response to fetal kick counts methods. Information provided on fetal kick counts. Explained that she soul go to the hospital, if movements are decreased or none at all and baby does not respond to methods to get him to move.  - Discussed next visit would be for incision check 2 wks after discharged from home from hospital   3) Diet controlled gestational diabetes mellitus (GDM) in third trimester, stable - No BS log brought to the office. FBS range = 70-85, 2 hr PP range = 120-153. - Advised to keep meals lower in carbs, because 2 hr PP numbers should stay below 120 - Will nee 2 hr GTT at [facility] visit  4) History of low transverse cesarean section - Scheduled C/S on Wednesday 04/30/2019 - No questions, has everything ready  Meds: No orders of the defined types were placed in this encounter.   Labs/procedures today: none  Treatment Plan:  Keep C/S date  Reviewed: Term labor symptoms and general obstetric precautions including but not limited to vaginal bleeding, contractions, leaking of fluid and fetal movement were reviewed in detail with the patient.  All questions were answered. Has home bp cuff. Check bp weekly, let us know if >140/90.   Follow-up: Return for incision check 2 wks after discharge from hospital.  No orders of the defined types were placed in this encounter.  Laury Deep MSN, CNM 04/24/2019 8:32 AM

## 2019-04-24 ENCOUNTER — Encounter: Payer: Self-pay | Admitting: Obstetrics and Gynecology

## 2019-04-24 ENCOUNTER — Ambulatory Visit (INDEPENDENT_AMBULATORY_CARE_PROVIDER_SITE_OTHER): Payer: Medicaid Other | Admitting: Obstetrics and Gynecology

## 2019-04-24 ENCOUNTER — Other Ambulatory Visit: Payer: Self-pay

## 2019-04-24 VITALS — BP 122/79 | HR 85 | Temp 97.4°F | Wt 172.2 lb

## 2019-04-24 DIAGNOSIS — O24410 Gestational diabetes mellitus in pregnancy, diet controlled: Secondary | ICD-10-CM

## 2019-04-24 DIAGNOSIS — Z98891 History of uterine scar from previous surgery: Secondary | ICD-10-CM

## 2019-04-24 DIAGNOSIS — Z3A38 38 weeks gestation of pregnancy: Secondary | ICD-10-CM

## 2019-04-24 DIAGNOSIS — Z3480 Encounter for supervision of other normal pregnancy, unspecified trimester: Secondary | ICD-10-CM

## 2019-04-24 DIAGNOSIS — O34219 Maternal care for unspecified type scar from previous cesarean delivery: Secondary | ICD-10-CM

## 2019-04-24 NOTE — Patient Instructions (Signed)
Fetal Movement Counts Patient Name: ________________________________________________ Patient Due Date: ____________________ What is a fetal movement count?  A fetal movement count is the number of times that you feel your baby move during a certain amount of time. This may also be called a fetal kick count. A fetal movement count is recommended for every pregnant woman. You may be asked to start counting fetal movements as early as week 28 of your pregnancy. Pay attention to when your baby is most active. You may notice your baby's sleep and wake cycles. You may also notice things that make your baby move more. You should do a fetal movement count:  When your baby is normally most active.  At the same time each day. A good time to count movements is while you are resting, after having something to eat and drink. How do I count fetal movements? 1. Find a quiet, comfortable area. Sit, or lie down on your side. 2. Write down the date, the start time and stop time, and the number of movements that you felt between those two times. Take this information with you to your health care visits. 3. Write down your start time when you feel the first movement. 4. Count kicks, flutters, swishes, rolls, and jabs. You should feel at least 10 movements. 5. You may stop counting after you have felt 10 movements, or if you have been counting for 2 hours. Write down the stop time. 6. If you do not feel 10 movements in 2 hours, contact your health care provider for further instructions. Your health care provider may want to do additional tests to assess your baby's well-being. Contact a health care provider if:  You feel fewer than 10 movements in 2 hours.  Your baby is not moving like he or she usually does. Date: ____________ Start time: ____________ Stop time: ____________ Movements: ____________ Date: ____________ Start time: ____________ Stop time: ____________ Movements: ____________ Date: ____________  Start time: ____________ Stop time: ____________ Movements: ____________ Date: ____________ Start time: ____________ Stop time: ____________ Movements: ____________ Date: ____________ Start time: ____________ Stop time: ____________ Movements: ____________ Date: ____________ Start time: ____________ Stop time: ____________ Movements: ____________ Date: ____________ Start time: ____________ Stop time: ____________ Movements: ____________ Date: ____________ Start time: ____________ Stop time: ____________ Movements: ____________ Date: ____________ Start time: ____________ Stop time: ____________ Movements: ____________ This information is not intended to replace advice given to you by your health care provider. Make sure you discuss any questions you have with your health care provider. Document Revised: 08/29/2018 Document Reviewed: 08/29/2018 Elsevier Patient Education  2020 [employer]  

## 2019-04-28 ENCOUNTER — Other Ambulatory Visit: Payer: Self-pay

## 2019-04-28 ENCOUNTER — Other Ambulatory Visit (HOSPITAL_COMMUNITY)
Admission: RE | Admit: 2019-04-28 | Discharge: 2019-04-28 | Disposition: A | Discharge status: Home / Self Care | Payer: Medicaid Other | Source: Ambulatory Visit | Attending: Obstetrics and Gynecology | Admitting: Obstetrics and Gynecology

## 2019-04-28 DIAGNOSIS — Z20822 Contact with and (suspected) exposure to covid-19: Secondary | ICD-10-CM | POA: Diagnosis present

## 2019-04-28 HISTORY — DX: Gestational diabetes mellitus in pregnancy, unspecified control: O24.419

## 2019-04-28 LAB — CBC
HCT: 35.4 % — ABNORMAL LOW (ref 36.0–46.0)
Hemoglobin: 11.1 g/dL — ABNORMAL LOW (ref 12.0–15.0)
MCH: 25.8 pg — ABNORMAL LOW (ref 26.0–34.0)
MCHC: 31.4 g/dL (ref 30.0–36.0)
MCV: 82.3 fL (ref 80.0–100.0)
Platelets: 237 10*3/uL (ref 150–400)
RBC: 4.3 MIL/uL (ref 3.87–5.11)
RDW: 14.6 % (ref 11.5–15.5)
WBC: 9 10*3/uL (ref 4.0–10.5)
nRBC: 0 % (ref 0.0–0.2)

## 2019-04-28 LAB — SARS CORONAVIRUS 2 (TAT 6-24 HRS): SARS Coronavirus 2: NEGATIVE

## 2019-04-28 LAB — TYPE AND SCREEN
ABO/RH(D): O POS
Antibody Screen: NEGATIVE

## 2019-04-28 LAB — RPR: RPR Ser Ql: NONREACTIVE

## 2019-04-28 NOTE — MAU Note (Signed)
Asymptomatic, swab collected, lab called.

## 2019-04-29 NOTE — Anesthesia Preprocedure Evaluation (Signed)
Anesthesia Evaluation    Reviewed: Allergy & Precautions, Patient's Chart, lab work & pertinent test results  Airway Mallampati: II  TM Distance: >3 FB Neck ROM: Full    Dental no notable dental hx.    Pulmonary neg pulmonary ROS,    Pulmonary exam normal breath sounds clear to auscultation       Cardiovascular negative cardio ROS Normal cardiovascular exam Rhythm:Regular Rate:Normal     Neuro/Psych negative neurological ROS  negative psych ROS   GI/Hepatic negative GI ROS, Neg liver ROS,   Endo/Other  diabetes, Well Controlled, GestationalObesity BMI 32  Renal/GU negative Renal ROS  negative genitourinary   Musculoskeletal negative musculoskeletal ROS (+)   Abdominal   Peds  Hematology  (+) Blood dyscrasia, anemia , hct 35.4, plt 237    Anesthesia Other Findings   Reproductive/Obstetrics (+) Pregnancy 1 prior c section                             Anesthesia Physical Anesthesia Plan  ASA: II  Anesthesia Plan: Spinal   Post-op Pain Management:    Induction:   PONV Risk Score and Plan: 3 and Ondansetron, Dexamethasone and Treatment may vary due to age or medical condition  Airway Management Planned: Natural Airway and Nasal Cannula  Additional Equipment: None  Intra-op Plan:   Post-operative Plan:   Informed Consent: I have reviewed the patients History and Physical, chart, labs and discussed the procedure including the risks, benefits and alternatives for the proposed anesthesia with the patient or authorized representative who has indicated his/her understanding and acceptance.       Plan Discussed with: CRNA  Anesthesia Plan Comments:         Anesthesia Quick Evaluation

## 2019-04-30 ENCOUNTER — Other Ambulatory Visit: Payer: Self-pay

## 2019-04-30 ENCOUNTER — Encounter (HOSPITAL_COMMUNITY): Payer: Self-pay | Admitting: Obstetrics and Gynecology

## 2019-04-30 ENCOUNTER — Inpatient Hospital Stay (HOSPITAL_COMMUNITY): Payer: Medicaid Other | Admitting: Anesthesiology

## 2019-04-30 ENCOUNTER — Encounter (HOSPITAL_COMMUNITY)
Admission: RE | Disposition: A | Discharge status: Home / Self Care | Payer: Self-pay | Source: Home / Self Care | Attending: Obstetrics and Gynecology

## 2019-04-30 ENCOUNTER — Inpatient Hospital Stay (HOSPITAL_COMMUNITY)
Admission: RE | Admit: 2019-04-30 | Discharge: 2019-05-02 | DRG: 788 | Disposition: A | Discharge status: Home / Self Care | Payer: Medicaid Other | Attending: Obstetrics and Gynecology | Admitting: Obstetrics and Gynecology

## 2019-04-30 DIAGNOSIS — E669 Obesity, unspecified: Secondary | ICD-10-CM | POA: Diagnosis present

## 2019-04-30 DIAGNOSIS — O34219 Maternal care for unspecified type scar from previous cesarean delivery: Secondary | ICD-10-CM | POA: Diagnosis not present

## 2019-04-30 DIAGNOSIS — O24420 Gestational diabetes mellitus in childbirth, diet controlled: Secondary | ICD-10-CM | POA: Diagnosis present

## 2019-04-30 DIAGNOSIS — O34211 Maternal care for low transverse scar from previous cesarean delivery: Secondary | ICD-10-CM | POA: Diagnosis present

## 2019-04-30 DIAGNOSIS — Z30017 Encounter for initial prescription of implantable subdermal contraceptive: Secondary | ICD-10-CM | POA: Diagnosis not present

## 2019-04-30 DIAGNOSIS — O24410 Gestational diabetes mellitus in pregnancy, diet controlled: Secondary | ICD-10-CM | POA: Diagnosis present

## 2019-04-30 DIAGNOSIS — Z3A39 39 weeks gestation of pregnancy: Secondary | ICD-10-CM | POA: Diagnosis not present

## 2019-04-30 DIAGNOSIS — Z3A38 38 weeks gestation of pregnancy: Secondary | ICD-10-CM | POA: Diagnosis not present

## 2019-04-30 DIAGNOSIS — Z975 Presence of (intrauterine) contraceptive device: Secondary | ICD-10-CM

## 2019-04-30 DIAGNOSIS — O99214 Obesity complicating childbirth: Secondary | ICD-10-CM | POA: Diagnosis present

## 2019-04-30 DIAGNOSIS — O26893 Other specified pregnancy related conditions, third trimester: Secondary | ICD-10-CM | POA: Diagnosis present

## 2019-04-30 DIAGNOSIS — Z98891 History of uterine scar from previous surgery: Secondary | ICD-10-CM

## 2019-04-30 LAB — GLUCOSE, CAPILLARY
Glucose-Capillary: 78 mg/dL (ref 70–99)
Glucose-Capillary: 72 mg/dL (ref 70–99)

## 2019-04-30 SURGERY — Surgical Case
Anesthesia: Spinal | Site: Abdomen | Wound class: Clean Contaminated

## 2019-04-30 MED ORDER — HYDROMORPHONE HCL 1 MG/ML IJ SOLN: 0.2500 mg | INTRAMUSCULAR | Status: DC | PRN

## 2019-04-30 MED ORDER — SIMETHICONE 80 MG PO CHEW
80.0000 mg | CHEWABLE_TABLET | ORAL | Status: DC
  Administered 2019-05-01 – 2019-05-02 (×3): 80 mg via ORAL
  Filled 2019-04-30 (×2): qty 1

## 2019-04-30 MED ORDER — OXYTOCIN 40 UNITS IN NORMAL SALINE INFUSION - SIMPLE MED
INTRAVENOUS | Status: AC
  Filled 2019-04-30: qty 1000

## 2019-04-30 MED ORDER — CEFAZOLIN SODIUM-DEXTROSE 2-3 GM-%(50ML) IV SOLR
INTRAVENOUS | Status: DC | PRN
  Administered 2019-04-30: 2 g via INTRAVENOUS

## 2019-04-30 MED ORDER — ONDANSETRON HCL 4 MG/2ML IJ SOLN: 4.0000 mg | Freq: Three times a day (TID) | INTRAMUSCULAR | Status: DC | PRN

## 2019-04-30 MED ORDER — OXYCODONE HCL 5 MG PO TABS
5.0000 mg | ORAL_TABLET | ORAL | Status: DC | PRN
  Administered 2019-05-02: 10 mg via ORAL
  Administered 2019-05-02: 5 mg via ORAL
  Filled 2019-04-30: qty 1
  Filled 2019-04-30: qty 2

## 2019-04-30 MED ORDER — NALBUPHINE HCL 10 MG/ML IJ SOLN: 5.0000 mg | INTRAMUSCULAR | Status: DC | PRN

## 2019-04-30 MED ORDER — OXYCODONE HCL 5 MG PO TABS: 5.0000 mg | ORAL_TABLET | Freq: Once | ORAL | Status: DC | PRN

## 2019-04-30 MED ORDER — DIPHENHYDRAMINE HCL 25 MG PO CAPS: 25.0000 mg | ORAL_CAPSULE | Freq: Four times a day (QID) | ORAL | Status: DC | PRN

## 2019-04-30 MED ORDER — SCOPOLAMINE 1 MG/3DAYS TD PT72
1.0000 | MEDICATED_PATCH | Freq: Once | TRANSDERMAL | Status: DC
  Administered 2019-04-30: 1.5 mg via TRANSDERMAL

## 2019-04-30 MED ORDER — KETOROLAC TROMETHAMINE 30 MG/ML IJ SOLN
30.0000 mg | Freq: Four times a day (QID) | INTRAMUSCULAR | Status: AC | PRN
  Administered 2019-04-30: 30 mg via INTRAMUSCULAR

## 2019-04-30 MED ORDER — CEFAZOLIN SODIUM-DEXTROSE 2-4 GM/100ML-% IV SOLN: 2.0000 g | INTRAVENOUS | Status: DC

## 2019-04-30 MED ORDER — DIPHENHYDRAMINE HCL 25 MG PO CAPS: 25.0000 mg | ORAL_CAPSULE | ORAL | Status: DC | PRN

## 2019-04-30 MED ORDER — SIMETHICONE 80 MG PO CHEW
80.0000 mg | CHEWABLE_TABLET | Freq: Three times a day (TID) | ORAL | Status: DC
  Administered 2019-05-01 – 2019-05-02 (×5): 80 mg via ORAL
  Filled 2019-04-30 (×4): qty 1

## 2019-04-30 MED ORDER — PHENYLEPHRINE HCL-NACL 20-0.9 MG/250ML-% IV SOLN
INTRAVENOUS | Status: DC | PRN
  Administered 2019-04-30: 60 ug/min via INTRAVENOUS

## 2019-04-30 MED ORDER — PRENATAL MULTIVITAMIN CH
1.0000 | ORAL_TABLET | Freq: Every day | ORAL | Status: DC
  Administered 2019-05-01 – 2019-05-02 (×2): 1 via ORAL
  Filled 2019-04-30 (×2): qty 1

## 2019-04-30 MED ORDER — DIPHENHYDRAMINE HCL 50 MG/ML IJ SOLN: 12.5000 mg | INTRAMUSCULAR | Status: DC | PRN

## 2019-04-30 MED ORDER — ZOLPIDEM TARTRATE 5 MG PO TABS: 5.0000 mg | ORAL_TABLET | Freq: Every evening | ORAL | Status: DC | PRN

## 2019-04-30 MED ORDER — NALBUPHINE HCL 10 MG/ML IJ SOLN: 5.0000 mg | Freq: Once | INTRAMUSCULAR | Status: DC | PRN

## 2019-04-30 MED ORDER — SODIUM CHLORIDE 0.9% FLUSH: 3.0000 mL | INTRAVENOUS | Status: DC | PRN

## 2019-04-30 MED ORDER — MEPERIDINE HCL 25 MG/ML IJ SOLN: 6.2500 mg | INTRAMUSCULAR | Status: DC | PRN

## 2019-04-30 MED ORDER — SENNOSIDES-DOCUSATE SODIUM 8.6-50 MG PO TABS
2.0000 | ORAL_TABLET | ORAL | Status: DC
  Administered 2019-05-01 – 2019-05-02 (×2): 2 via ORAL
  Filled 2019-04-30 (×2): qty 2

## 2019-04-30 MED ORDER — ACETAMINOPHEN 500 MG PO TABS
1000.0000 mg | ORAL_TABLET | ORAL | Status: AC
  Administered 2019-04-30: 1000 mg via ORAL

## 2019-04-30 MED ORDER — KETOROLAC TROMETHAMINE 30 MG/ML IJ SOLN
30.0000 mg | Freq: Four times a day (QID) | INTRAMUSCULAR | Status: AC | PRN
  Administered 2019-05-01: 30 mg via INTRAVENOUS
  Filled 2019-04-30 (×2): qty 1

## 2019-04-30 MED ORDER — CEFAZOLIN SODIUM-DEXTROSE 2-4 GM/100ML-% IV SOLN
INTRAVENOUS | Status: AC
  Filled 2019-04-30: qty 100

## 2019-04-30 MED ORDER — CELECOXIB 200 MG PO CAPS
400.0000 mg | ORAL_CAPSULE | ORAL | Status: AC
  Administered 2019-04-30: 400 mg via ORAL

## 2019-04-30 MED ORDER — FENTANYL CITRATE (PF) 100 MCG/2ML IJ SOLN
INTRAMUSCULAR | Status: AC
  Filled 2019-04-30: qty 2

## 2019-04-30 MED ORDER — OXYCODONE HCL 5 MG/5ML PO SOLN: 5.0000 mg | Freq: Once | ORAL | Status: DC | PRN

## 2019-04-30 MED ORDER — MORPHINE SULFATE (PF) 0.5 MG/ML IJ SOLN
INTRAMUSCULAR | Status: DC | PRN
  Administered 2019-04-30: .15 mg via INTRATHECAL

## 2019-04-30 MED ORDER — COCONUT OIL OIL: 1.0000 "application " | TOPICAL_OIL | Status: DC | PRN

## 2019-04-30 MED ORDER — PHENYLEPHRINE HCL-NACL 20-0.9 MG/250ML-% IV SOLN
INTRAVENOUS | Status: AC
  Filled 2019-04-30: qty 250

## 2019-04-30 MED ORDER — MENTHOL 3 MG MT LOZG: 1.0000 | LOZENGE | OROMUCOSAL | Status: DC | PRN

## 2019-04-30 MED ORDER — SIMETHICONE 80 MG PO CHEW
80.0000 mg | CHEWABLE_TABLET | ORAL | Status: DC | PRN
  Filled 2019-04-30: qty 1

## 2019-04-30 MED ORDER — ACETAMINOPHEN 500 MG PO TABS
ORAL_TABLET | ORAL | Status: AC
  Filled 2019-04-30: qty 2

## 2019-04-30 MED ORDER — OXYTOCIN 40 UNITS IN NORMAL SALINE INFUSION - SIMPLE MED
2.5000 [IU]/h | INTRAVENOUS | Status: AC
  Administered 2019-04-30: 2.5 [IU]/h via INTRAVENOUS

## 2019-04-30 MED ORDER — PROMETHAZINE HCL 25 MG/ML IJ SOLN: 6.2500 mg | INTRAMUSCULAR | Status: DC | PRN

## 2019-04-30 MED ORDER — SODIUM CHLORIDE 0.9 % IR SOLN
Status: DC | PRN
  Administered 2019-04-30: 1000 mL

## 2019-04-30 MED ORDER — DIBUCAINE (PERIANAL) 1 % EX OINT: 1.0000 "application " | TOPICAL_OINTMENT | CUTANEOUS | Status: DC | PRN

## 2019-04-30 MED ORDER — NALOXONE HCL 4 MG/10ML IJ SOLN
1.0000 ug/kg/h | INTRAVENOUS | Status: DC | PRN
  Filled 2019-04-30: qty 5

## 2019-04-30 MED ORDER — KETOROLAC TROMETHAMINE 30 MG/ML IJ SOLN: 30.0000 mg | Freq: Once | INTRAMUSCULAR | Status: DC | PRN

## 2019-04-30 MED ORDER — LACTATED RINGERS IV SOLN: INTRAVENOUS | Status: DC

## 2019-04-30 MED ORDER — FENTANYL CITRATE (PF) 100 MCG/2ML IJ SOLN
INTRAMUSCULAR | Status: DC | PRN
  Administered 2019-04-30: 15 ug via INTRATHECAL

## 2019-04-30 MED ORDER — LACTATED RINGERS IV SOLN
125.0000 mL/h | INTRAVENOUS | Status: DC
  Administered 2019-04-30: 125 mL/h via INTRAVENOUS

## 2019-04-30 MED ORDER — MORPHINE SULFATE (PF) 0.5 MG/ML IJ SOLN
INTRAMUSCULAR | Status: AC
  Filled 2019-04-30: qty 10

## 2019-04-30 MED ORDER — CELECOXIB 200 MG PO CAPS
ORAL_CAPSULE | ORAL | Status: AC
  Filled 2019-04-30: qty 2

## 2019-04-30 MED ORDER — TETANUS-DIPHTH-ACELL PERTUSSIS 5-2.5-18.5 LF-MCG/0.5 IM SUSP: 0.5000 mL | Freq: Once | INTRAMUSCULAR | Status: DC

## 2019-04-30 MED ORDER — STERILE WATER FOR IRRIGATION IR SOLN
Status: DC | PRN
  Administered 2019-04-30: 1000 mL

## 2019-04-30 MED ORDER — ONDANSETRON HCL 4 MG/2ML IJ SOLN
INTRAMUSCULAR | Status: AC
  Filled 2019-04-30: qty 2

## 2019-04-30 MED ORDER — OXYTOCIN 40 UNITS IN NORMAL SALINE INFUSION - SIMPLE MED
INTRAVENOUS | Status: DC | PRN
  Administered 2019-04-30 (×2): 250 mL via INTRAVENOUS

## 2019-04-30 MED ORDER — BUPIVACAINE IN DEXTROSE 0.75-8.25 % IT SOLN
INTRATHECAL | Status: DC | PRN
  Administered 2019-04-30: 1.3 mL via INTRATHECAL

## 2019-04-30 MED ORDER — ACETAMINOPHEN 500 MG PO TABS
1000.0000 mg | ORAL_TABLET | Freq: Four times a day (QID) | ORAL | Status: AC
  Administered 2019-04-30 – 2019-05-01 (×2): 1000 mg via ORAL
  Filled 2019-04-30 (×3): qty 2

## 2019-04-30 MED ORDER — ONDANSETRON HCL 4 MG/2ML IJ SOLN
INTRAMUSCULAR | Status: DC | PRN
  Administered 2019-04-30: 4 mg via INTRAVENOUS

## 2019-04-30 MED ORDER — NALOXONE HCL 0.4 MG/ML IJ SOLN: 0.4000 mg | INTRAMUSCULAR | Status: DC | PRN

## 2019-04-30 MED ORDER — KETOROLAC TROMETHAMINE 30 MG/ML IJ SOLN
INTRAMUSCULAR | Status: AC
  Filled 2019-04-30: qty 1

## 2019-04-30 MED ORDER — SCOPOLAMINE 1 MG/3DAYS TD PT72
MEDICATED_PATCH | TRANSDERMAL | Status: AC
  Filled 2019-04-30: qty 1

## 2019-04-30 MED ORDER — WITCH HAZEL-GLYCERIN EX PADS: 1.0000 "application " | MEDICATED_PAD | CUTANEOUS | Status: DC | PRN

## 2019-04-30 SURGICAL SUPPLY — 30 items
BENZOIN TINCTURE PRP APPL 2/3 (GAUZE/BANDAGES/DRESSINGS) ×2 IMPLANT
CHLORAPREP W/TINT 26ML (MISCELLANEOUS) ×2 IMPLANT
CLAMP CORD UMBIL (MISCELLANEOUS) ×1 IMPLANT
CLOTH BEACON ORANGE TIMEOUT ST (SAFETY) ×2 IMPLANT
DRSG OPSITE POSTOP 4X10 (GAUZE/BANDAGES/DRESSINGS) ×2 IMPLANT
ELECT REM PT RETURN 9FT ADLT (ELECTROSURGICAL) ×2
ELECTRODE REM PT RTRN 9FT ADLT (ELECTROSURGICAL) ×1 IMPLANT
EXTRACTOR VACUUM M CUP 4 TUBE (SUCTIONS) ×1 IMPLANT
GLOVE BIOGEL PI IND STRL 7.0 (GLOVE) ×2 IMPLANT
GLOVE BIOGEL PI IND STRL 7.5 (GLOVE) ×2 IMPLANT
GLOVE BIOGEL PI INDICATOR 7.0 (GLOVE) ×2
GLOVE BIOGEL PI INDICATOR 7.5 (GLOVE) ×2
GLOVE ECLIPSE 7.5 STRL STRAW (GLOVE) ×2 IMPLANT
GOWN STRL REUS W/TWL LRG LVL3 (GOWN DISPOSABLE) ×6 IMPLANT
NS IRRIG 1000ML POUR BTL (IV SOLUTION) ×2 IMPLANT
PACK C SECTION WH (CUSTOM PROCEDURE TRAY) ×2 IMPLANT
PAD OB MATERNITY 4.3X12.25 (PERSONAL CARE ITEMS) ×2 IMPLANT
PENCIL SMOKE EVAC W/HOLSTER (ELECTROSURGICAL) ×2 IMPLANT
RTRCTR C-SECT PINK 25CM LRG (MISCELLANEOUS) ×2 IMPLANT
STRIP CLOSURE SKIN 1/2X4 (GAUZE/BANDAGES/DRESSINGS) ×2 IMPLANT
SUT PLAIN 2 0 XLH (SUTURE) ×1 IMPLANT
SUT VIC AB 0 CT1 36 (SUTURE) ×2 IMPLANT
SUT VIC AB 0 CTX 36 (SUTURE) ×2
SUT VIC AB 0 CTX36XBRD ANBCTRL (SUTURE) ×2 IMPLANT
SUT VIC AB 2-0 CT1 27 (SUTURE) ×1
SUT VIC AB 2-0 CT1 TAPERPNT 27 (SUTURE) ×1 IMPLANT
SUT VIC AB 4-0 KS 27 (SUTURE) ×2 IMPLANT
TOWEL OR 17X24 6PK STRL BLUE (TOWEL DISPOSABLE) ×2 IMPLANT
TRAY FOLEY W/BAG SLVR 14FR LF (SET/KITS/TRAYS/PACK) ×2 IMPLANT
WATER STERILE IRR 1000ML POUR (IV SOLUTION) ×2 IMPLANT

## 2019-04-30 NOTE — Discharge Summary (Signed)
Postpartum Discharge Summary    Patient Name: Tammie Diaz DOB: 1995/03/21 MRN: [id number]  Date of admission: 04/30/2019 Delivering Provider: Laurey Arrow [place]   Date of discharge: 05/02/2019  Admitting diagnosis: Status post repeat low transverse cesarean section [Z98.891] Intrauterine pregnancy: [redacted]w[redacted]d    Secondary diagnosis:  Active Problems:   History of low transverse cesarean section   Diet controlled gestational diabetes mellitus (GDM) in third trimester   Status post repeat low transverse cesarean section   Nexplanon in place  Additional problems: None     Discharge diagnosis: Term Pregnancy Delivered and GDM A1                                                                                                Post partum procedures: Nexplanon insertion  Augmentation: NA  Complications: None  Hospital course:  Sceduled C/S   24y.o. yo G2P1001 at 347w0das admitted to the hospital 04/30/2019 for scheduled cesarean section with the following indication:Elective Repeat.  Membrane Rupture Time/Date: 9:56 AM ,04/30/2019   Patient delivered a Viable infant.04/30/2019  Details of operation can be found in separate operative note. Fasting AM glucose 100. Patient had an uncomplicated postpartum course.  She is ambulating, tolerating a regular diet, passing flatus, and urinating well. Patient is discharged home in stable condition on  05/02/19        Delivery time: 9:57 AM    Magnesium Sulfate received: No BMZ received: No Rhophylac:No MMR:No Transfusion:No  Physical exam  Vitals:   05/01/19 0517 05/01/19 1424 05/01/19 2051 05/02/19 0525  BP: 97/68 117/64 127/78 114/65  Pulse: 83 82 100 80  Resp: '20 16 16 20  ' Temp: 98.3 F (36.8 C) 98.7 F (37.1 C) 97.9 F (36.6 C) 98.2 F (36.8 C)  TempSrc: Oral Oral Oral Oral  SpO2: 98%  99% 97%  Weight:      Height:       General: alert, cooperative and no distress Lochia: appropriate Uterine Fundus: firm Incision: mild  strikethrough but otherwise not actively bleeding, no erythema or exudate DVT Evaluation: No evidence of DVT seen on physical exam. No significant calf/ankle edema. Labs: Lab Results  Component Value Date   WBC 5.5 05/01/2019   HGB 8.7 (L) 05/01/2019   HCT 27.6 (L) 05/01/2019   MCV 82.4 05/01/2019   PLT 195 05/01/2019   CMP Latest Ref Rng & Units 05/01/2019  Glucose 70 - 99 mg/dL 100(H)  BUN 6 - 20 mg/dL -  Creatinine 0.44 - 1.00 mg/dL -  Sodium 135 - 145 mmol/L -  Potassium 3.5 - 5.1 mmol/L -  Chloride 98 - 111 mmol/L -  CO2 22 - 32 mmol/L -  Calcium 8.9 - 10.3 mg/dL -   Edinburgh Score: Edinburgh Postnatal Depression Scale Screening Tool 05/01/2019  I have been able to laugh and see the funny side of things. 0  I have looked forward with enjoyment to things. 0  I have blamed myself unnecessarily when things went wrong. 0  I have been anxious or worried for no good reason. 0  I have felt scared or panicky for  no good reason. 0  Things have been getting on top of me. 0  I have been so unhappy that I have had difficulty sleeping. 0  I have felt sad or miserable. 0  I have been so unhappy that I have been crying. 0  The thought of harming myself has occurred to me. 0  Edinburgh Postnatal Depression Scale Total 0    Discharge instruction: per After Visit Summary and "Baby and Me Booklet".  After visit meds:  Allergies as of 05/02/2019   No Known Allergies     Medication List    STOP taking these medications   Accu-Chek Guide test strip Generic drug: glucose blood   accu-chek soft touch lancets     TAKE these medications   acetaminophen 325 MG tablet Commonly known as: TYLENOL Take 2 tablets (650 mg total) by mouth every 4 (four) hours as needed for moderate pain.   Ferrous Fumarate 324 (106 Fe) MG Tabs tablet Commonly known as: HEMOCYTE - 106 mg FE Take 1 tablet (106 mg of iron total) by mouth daily.   ibuprofen 600 MG tablet Commonly known as: ADVIL Take 1  tablet (600 mg total) by mouth every 6 (six) hours as needed (pain).   oxyCODONE 5 MG immediate release tablet Commonly known as: Oxy IR/ROXICODONE Take 1-2 tablets (5-10 mg total) by mouth every 4 (four) hours as needed for moderate pain.   polyethylene glycol powder 17 GM/SCOOP powder Commonly known as: GLYCOLAX/MIRALAX Take 17 g by mouth daily as needed.   prenatal multivitamin Tabs tablet Take 1 tablet by mouth daily at 12 noon.       Diet: routine diet  Activity: Advance as tolerated. Pelvic rest for 6 weeks.   Outpatient follow up:4 weeks Follow up Appt: Future Appointments  Date Time Provider [place]  05/14/2019  9:30 AM Marcille Buffy D, CNM [facility] None  06/11/2019  8:10 AM Laury Deep, CNM [facility] None   Follow up Visit:    Please schedule this patient for Postpartum visit in: 4 weeks with the following provider: Any provider Virtual For C/S patients schedule nurse incision check in weeks 2 weeks: yes High risk pregnancy complicated by: GDM Delivery mode:  CS Anticipated Birth Control:  PP Nexplanon placed PP Procedures needed: 2 hour GTT and incision check Schedule Integrated BH visit: no     Newborn Data: Live born female  Birth Weight: 2880 grams   APGAR: 37, 10  Newborn Delivery   Birth date/time: 04/30/2019 09:57:00 Delivery type: C-Section, Vacuum Assisted Trial of labor: No C-section categorization: Repeat      Baby Feeding: Bottle Disposition:home with mother   05/02/2019 Clarnce Flock, MD

## 2019-04-30 NOTE — Op Note (Signed)
Tammie Diaz PROCEDURE DATE: 04/30/2019  PREOPERATIVE DIAGNOSES: Intrauterine pregnancy at [redacted]w[redacted]d weeks gestation; elective repeat  POSTOPERATIVE DIAGNOSES: The same; Vacuum-assisted   PROCEDURE: Repeat Low Transverse Cesarean Section  SURGEON:  Dr. Shonna Chock - Primary Dr. Jerilynn Birkenhead - Fellow  ANESTHESIOLOGY TEAM: Anesthesiologist: Lannie Fields, DO CRNA: Cleda Clarks, CRNA  INDICATIONS: Tammie Diaz is a 24 y.o. G2P1001 at [redacted]w[redacted]d here for cesarean section secondary to the indications listed under preoperative diagnoses; please see preoperative note for further details.  The risks of cesarean section were discussed with the patient including but were not limited to: bleeding which may require transfusion or reoperation; infection which may require antibiotics; injury to bowel, bladder, ureters or other surrounding organs; injury to the fetus; need for additional procedures including hysterectomy in the event of a life-threatening hemorrhage; placental abnormalities wth subsequent pregnancies, incisional problems, thromboembolic phenomenon and other postoperative/anesthesia complications.   The patient concurred with the proposed plan, giving informed written consent for the procedure.    FINDINGS:  Viable female infant in cephalic presentation. Clear amniotic fluid.  Intact placenta, three vessel cord.  Normal uterus, fallopian tubes and ovaries bilaterally. APGAR (1 MIN): 9   APGAR (5 MINS): 10   APGAR (10 MINS):    ANESTHESIA: Spinal INTRAVENOUS FLUIDS: 1000 ml   ESTIMATED BLOOD LOSS: 367 ml URINE OUTPUT:  50 ml SPECIMENS: Placenta sent to L&D COMPLICATIONS: None immediate  PROCEDURE IN DETAIL:  The patient preoperatively received intravenous antibiotics and had sequential compression devices applied to her lower extremities.  She was then taken to the operating room where spinal anesthesia was administered and was found to be adequate. She was then placed in a dorsal  supine position with a leftward tilt, and prepped and draped in a sterile manner.  A foley catheter was placed into her bladder and attached to constant gravity.  After an adequate timeout was performed, a Pfannenstiel skin incision was made with scalpel on her preexisting scar and carried through to the underlying layer of fascia. The fascia was incised in the midline, and this incision was extended bilaterally using the Mayo scissors.  Kocher clamps were applied to the superior aspect of the fascial incision and the underlying rectus muscles were dissected off bluntly and sharply.  A similar process was carried out on the inferior aspect of the fascial incision. The rectus muscles were separated in the midline and the peritoneum was entered bluntly. The Alexis self-retaining retractor was introduced into the abdominal cavity.  Attention was turned to the lower uterine segment where a low transverse hysterotomy was made with a scalpel and extended bilaterally bluntly. Due to difficult extraction by angle of vertex, the infant was successfully delivered using a Bell vacuum, the cord was clamped and cut after one minute, and the infant was handed over to the awaiting neonatology team. Uterine massage was then administered, and the placenta delivered intact with a three-vessel cord. The uterus was then cleared of clots and debris.  The hysterotomy was closed with 0 Vicryl in a running locked fashion, and an imbricating layer was also placed with 0 Vicryl.  Figure-of-eight 0 Vicryl serosal stitches were placed to help with hemostasis.  The pelvis was cleared of all clot and debris. Hemostasis was confirmed on all surfaces.  The retractor was removed.  The peritoneum was closed with a 0 Vicryl running stitch. The fascia was then closed using 0 Vicryl in a running fashion.  The subcutaneous layer was irrigated, reapproximated with 2-0 plain gut  running stitches and the skin was closed with a 4-0 Vicryl subcuticular  stitch. The patient tolerated the procedure well. Sponge, instrument and needle counts were correct x 3.  She was taken to the recovery room in stable condition.   Barrington Ellison, MD [facility] Family [facility] Fellow, [facility] for [employer], [place]

## 2019-04-30 NOTE — Anesthesia Postprocedure Evaluation (Signed)
Anesthesia Post Note  Patient: Tammie Diaz  Procedure(s) Performed: CESAREAN SECTION (N/A Abdomen)     Patient location during evaluation: PACU Anesthesia Type: Spinal Level of consciousness: oriented and awake and alert Pain management: pain level controlled Vital Signs Assessment: post-procedure vital signs reviewed and stable Respiratory status: spontaneous breathing and respiratory function stable Cardiovascular status: blood pressure returned to baseline and stable Postop Assessment: no headache, no backache, no apparent nausea or vomiting, patient able to bend at knees and spinal receding Anesthetic complications: no    Last Vitals:  Vitals:   04/30/19 1130 04/30/19 1144  BP: 113/82 112/69  Pulse: 82 64  Resp: 19 17  Temp:  36.6 C  SpO2: 97% 98%    Last Pain:  Vitals:   04/30/19 1145  TempSrc:   PainSc: 0-No pain   Pain Goal:                Epidural/Spinal Function Cutaneous sensation: Tingles (04/30/19 1145), Patient able to flex knees: Yes (04/30/19 1145), Patient able to lift hips off bed: Yes (04/30/19 1145), Back pain beyond tenderness at insertion site: No (04/30/19 1145), Progressively worsening motor and/or sensory loss: No (04/30/19 1145), Bowel and/or bladder incontinence post epidural: No (04/30/19 1145)  Tammie Diaz

## 2019-04-30 NOTE — H&P (Signed)
Obstetric Preoperative History and Physical  Tammie Diaz is a 24 y.o. G2P1001 with IUP at [redacted]w[redacted]d presenting for scheduled cesarean section.  Reports good fetal movement, no bleeding, no contractions, no leaking of fluid.  No acute preoperative concerns.    Cesarean Section Indication: elective repeat; declines TOLAC  Prenatal Course Source of Care: [facility]   Pregnancy complications or risks: Patient Active Problem List   Diagnosis Date Noted  . Diet controlled gestational diabetes mellitus (GDM) in third trimester 03/19/2019  . Nausea and vomiting during pregnancy prior to [redacted] weeks gestation 10/30/2018  . History of preterm labor 10/30/2018  . History of low transverse cesarean section 10/30/2018  . Supervision of other normal pregnancy, antepartum 10/15/2018   She plans to breastfeed She desires Nexplanon insertion for postpartum contraception.   Prenatal labs and studies: ABO, Rh: --/--/O POS (04/05 0859) Antibody: NEG (04/05 0859) Rubella: 8.11 (10/07 1120) RPR: NON REACTIVE (04/05 0859)  HBsAg: Negative (10/07 1120)  HIV: Non Reactive (01/26 0810)  JGO:TLXBWIOM/-- (03/18 0828) 2 hr Glucola  abnormal Genetic screening normal Anatomy US normal  Prenatal Transfer Tool  Maternal Diabetes: Yes:  Diabetes Type:  Diet controlled Genetic Screening: Normal Maternal Ultrasounds/Referrals: Normal Fetal Ultrasounds or other Referrals:  None Maternal Substance Abuse:  No Significant Maternal Medications:  None Significant Maternal Lab Results: Group B Strep negative  Past Medical History:  Diagnosis Date  . Gestational diabetes   . Medical history non-contributory     Past Surgical History:  Procedure Laterality Date  . CESAREAN SECTION      OB History  Gravida Para Term Preterm AB Living  2 1 1     1   SAB TAB Ectopic Multiple Live Births          1    # Outcome Date GA Lbr Len/2nd Weight Sex Delivery Anes PTL Lv  2 Current           1 Term 07/04/16 [redacted]w[redacted]d  3374 g F  CS-Unspec EPI N LIV     Complications: Short cervix    Social History   Socioeconomic History  . Marital status: Married    Spouse name: [redacted]w[redacted]d  . Number of children: 1  . Years of education: Not on file  . Highest education level: High school graduate  Occupational History  . Occupation: unemployed  Tobacco Use  . Smoking status: Never Smoker  . Smokeless tobacco: Never Used  Substance and Sexual Activity  . Alcohol use: Not Currently  . Drug use: Never  . Sexual activity: Yes  Other Topics Concern  . Not on file  Social History Narrative  . Not on file   Social Determinants of Health   Financial Resource Strain: Low Risk   . Difficulty of Paying Living Expenses: Not hard at all  Food Insecurity: No Food Insecurity  . Worried About Ainsley Spinner in the Last Year: Never true  . Ran Out of Food in the Last Year: Never true  Transportation Needs: No Transportation Needs  . Lack of Transportation (Medical): No  . Lack of Transportation (Non-Medical): No  Physical Activity: Insufficiently Active  . Days of Exercise per Week: 3 days  . Minutes of Exercise per Session: 30 min  Stress: No Stress Concern Present  . Feeling of Stress : Not at all  Social Connections: Somewhat Isolated  . Frequency of Communication with Friends and Family: More than three times a week  . Frequency of Social Gatherings with Friends and Family: More  than three times a week  . Attends Religious Services: Never  . Active Member of Clubs or Organizations: No  . Attends Archivist Meetings: Never  . Marital Status: Married    History reviewed. No pertinent family history.  Medications Prior to Admission  Medication Sig Dispense Refill Last Dose  . Prenatal Vit-Fe Fumarate-FA (PRENATAL MULTIVITAMIN) TABS tablet Take 1 tablet by mouth daily at 12 noon.     Marland Kitchen glucose blood (ACCU-CHEK GUIDE) test strip Use as instructed 100 each 12   . Lancets (ACCU-CHEK SOFT TOUCH) lancets Use  as instructed 100 each 12     No Known Allergies  Review of Systems: Pertinent items noted in HPI and remainder of comprehensive ROS otherwise negative.  Physical Exam: BP (!) 126/98   Temp 98.1 F (36.7 C) (Oral)   Ht 5\' 2"  (1.575 m)   Wt 79.4 kg   LMP 07/18/2018   BMI 32.01 kg/m  FHR by Doppler: 143 bpm CONSTITUTIONAL: Well-developed, well-nourished female in no acute distress.  HENT:  Normocephalic, atraumatic, External right and left ear normal. Oropharynx is clear and moist EYES: Conjunctivae and EOM are normal. No scleral icterus.  NECK: Normal range of motion, supple, no masses SKIN: Skin is warm and dry. No rash noted. Not diaphoretic. No erythema. No pallor. Sebastian: Alert and oriented to person, place, and time. Normal reflexes, muscle tone coordination. No cranial nerve deficit noted. PSYCHIATRIC: Normal mood and affect. Normal behavior. Normal judgment and thought content. CARDIOVASCULAR: Normal heart rate noted RESPIRATORY: Effort and breath sounds normal, no problems with respiration noted ABDOMEN: Soft, nontender, nondistended, gravid. Well-healed Pfannenstiel incision. PELVIC: Deferred MUSCULOSKELETAL: Normal range of motion. No edema and no tenderness. 2+ distal pulses.   Pertinent Labs/Studies:   Results for orders placed or performed during the hospital encounter of 04/30/19 (from the past 72 hour(s))  Glucose, capillary     Status: None   Collection Time: 04/30/19  7:59 AM  Result Value Ref Range   Glucose-Capillary 78 70 - 99 mg/dL    Comment: Glucose reference range applies only to samples taken after fasting for at least 8 hours.    Assessment and Plan: Tammie Diaz is a 24 y.o. G2P1001 at [redacted]w[redacted]d being admitted for scheduled cesarean section. The risks of cesarean section discussed with the patient included but were not limited to: bleeding which may require transfusion or reoperation; infection which may require antibiotics; injury to bowel, bladder,  ureters or other surrounding organs; injury to the fetus; need for additional procedures including hysterectomy in the event of a life-threatening hemorrhage; placental abnormalities with subsequent pregnancies, incisional problems, thromboembolic phenomenon and other postoperative/anesthesia complications. The patient concurred with the proposed plan, giving informed written consent for the procedure. Patient has been NPO since last night she will remain NPO for procedure. Anesthesia and OR aware. Preoperative prophylactic antibiotics and SCDs ordered on call to the OR. To OR when ready.   Pregnancy Complications: Denies other abdominal surgeries beyond first c-section (patient reports she was told she had CPD and c-section recommended) GDMA1: fasting glucose post-partum and GTT outpatient.  MOF: Breast Contraception: Nexplanon; plan for insertion post-partum Circumcision: yes; inpatient   Barrington Ellison, MD [facility] Fellow, [facility] for [facility], [place]

## 2019-04-30 NOTE — Transfer of Care (Signed)
Immediate Anesthesia Transfer of Care Note  Patient: Tammie Diaz  Procedure(s) Performed: CESAREAN SECTION (N/A Abdomen)  Patient Location: PACU  Anesthesia Type:Spinal  Level of Consciousness: awake, alert  and oriented  Airway & Oxygen Therapy: Patient Spontanous Breathing  Post-op Assessment: Report given to RN and Post -op Vital signs reviewed and stable  Post vital signs: Reviewed and stable  Last Vitals:  Vitals Value Taken Time  BP 124/62 04/30/19 1102  Temp 36.4 C 04/30/19 1037  Pulse 77 04/30/19 1105  Resp 10 04/30/19 1105  SpO2 96 % 04/30/19 1105  Vitals shown include unvalidated device data.  Last Pain:  Vitals:   04/30/19 1045  TempSrc:   PainSc: 0-No pain         Complications: No apparent anesthesia complications

## 2019-04-30 NOTE — Anesthesia Procedure Notes (Signed)
Spinal  Patient location during procedure: OR Start time: 04/30/2019 9:30 AM End time: 04/30/2019 9:45 AM Staffing Performed: anesthesiologist  Anesthesiologist: Lannie Fields, DO Preanesthetic Checklist Completed: patient identified, IV checked, risks and benefits discussed, surgical consent, monitors and equipment checked, pre-op evaluation and timeout performed Spinal Block Patient position: sitting Prep: DuraPrep and site prepped and draped Patient monitoring: cardiac monitor, continuous pulse ox and blood pressure Approach: midline Location: L3-4 Injection technique: single-shot Needle Needle type: Pencan  Needle gauge: 24 G Needle length: 9 cm Assessment Sensory level: T6 Additional Notes Functioning IV was confirmed and monitors were applied. Sterile prep and drape, including hand hygiene and sterile gloves were used. The patient was positioned and the spine was prepped. The skin was anesthetized with lidocaine.  Free flow of clear CSF was obtained prior to injecting local anesthetic into the CSF.  The spinal needle aspirated freely following injection.  The needle was carefully withdrawn.  The patient tolerated the procedure well.

## 2019-05-01 DIAGNOSIS — Z30017 Encounter for initial prescription of implantable subdermal contraceptive: Secondary | ICD-10-CM

## 2019-05-01 LAB — CBC
HCT: 27.6 % — ABNORMAL LOW (ref 36.0–46.0)
Hemoglobin: 8.7 g/dL — ABNORMAL LOW (ref 12.0–15.0)
MCH: 26 pg (ref 26.0–34.0)
MCHC: 31.5 g/dL (ref 30.0–36.0)
MCV: 82.4 fL (ref 80.0–100.0)
Platelets: 195 10*3/uL (ref 150–400)
RBC: 3.35 MIL/uL — ABNORMAL LOW (ref 3.87–5.11)
RDW: 15.1 % (ref 11.5–15.5)
WBC: 5.5 10*3/uL (ref 4.0–10.5)
nRBC: 0 % (ref 0.0–0.2)

## 2019-05-01 LAB — GLUCOSE, RANDOM: Glucose, Bld: 100 mg/dL — ABNORMAL HIGH (ref 70–99)

## 2019-05-01 MED ORDER — LIDOCAINE HCL 1 % IJ SOLN
INTRAMUSCULAR | Status: AC
  Administered 2019-05-01: 20 mL via INTRADERMAL
  Filled 2019-05-01: qty 20

## 2019-05-01 MED ORDER — LIDOCAINE HCL 1 % IJ SOLN: 0.0000 mL | Freq: Once | INTRAMUSCULAR | Status: AC | PRN

## 2019-05-01 MED ORDER — FERROUS FUMARATE 324 (106 FE) MG PO TABS
1.0000 | ORAL_TABLET | Freq: Every day | ORAL | Status: DC
  Administered 2019-05-01 – 2019-05-02 (×2): 106 mg via ORAL
  Filled 2019-05-01 (×2): qty 1

## 2019-05-01 MED ORDER — ETONOGESTREL 68 MG ~~LOC~~ IMPL
68.0000 mg | DRUG_IMPLANT | Freq: Once | SUBCUTANEOUS | Status: AC
  Administered 2019-05-01: 68 mg via SUBCUTANEOUS
  Filled 2019-05-01: qty 1

## 2019-05-01 NOTE — Plan of Care (Signed)
Patient verbalized understanding  

## 2019-05-01 NOTE — Progress Notes (Addendum)
Subjective: Postpartum Day 1: Cesarean Delivery Patient reports tolerating PO, no bleeding, and in no pain. She reports ambulating. She reports no urination or flatulence/bowel movement but has not felt the need. She currently plans to bottle feed and use Nexplanon for contraception.   Objective: Vital signs in last 24 hours: Temp:  [97.2 F (36.2 C)-99.7 F (37.6 C)] 98.3 F (36.8 C) (04/08 0517) Pulse Rate:  [64-83] 83 (04/08 0517) Resp:  [14-20] 20 (04/08 0517) BP: (97-124)/(55-93) 97/68 (04/08 0517) SpO2:  [96 %-99 %] 98 % (04/08 0517)  Physical Exam:  General: alert, cooperative and tired but happy Lochia: appropriate Uterine Fundus: firm Incision: no significant drainage, no significant erythema DVT Evaluation: No evidence of DVT seen on physical exam.  Recent Labs    04/28/19 0859 05/01/19 0519  HGB 11.1* 8.7*  HCT 35.4* 27.6*  Fasting Glucose - 100 (4/08) @ 0519  Assessment/Plan: Status post Cesarean section. Doing well postoperatively.  Continue current care. Possible d/c tomorrow or Saturday if baby is able.   Laural Diaz MS3 05/01/2019, 7:54 AM    RESIDENT ATTESTATION OF STUDENT NOTE   I have seen and examined this patient.   I have discussed the findings and exam with the medical student and agree with the above note, which I have edited appropriately. I helped develop the management plan that is described in the student's note, and I agree with the content.    Tammie Cabal, DO PGY-1, Tammie Diaz 05/01/2019 8:53 AM     Midwife attestation I have seen and examined this patient and agree with above documentation in the resident's note.   Post Partum Day 1  Tammie Diaz is a 25 y.o. [id number] s/p CS.  Pt denies problems with ambulating or po intake. She is due to void after removal of foley. Pain is well controlled. Method of Feeding: bottle  PE:  Gen: well appearing Heart: reg rate Lungs: normal WOB Fundus firm Ext: soft, no  pain, no edema  Assessment: S/p CS POD #1 A1GDM- stable  Plan for discharge: possibly tomorrow  Tammie Diaz, CNM 10:19 AM

## 2019-05-01 NOTE — Procedures (Signed)
GYNECOLOGY PROCEDURE NOTE  Tammie Diaz is a 24 y.o. [id number] requesting Nexplanon insertion. No gynecologic concerns.  Nexplanon Insertion Procedure Patient identified, informed consent performed, consent signed. Patient does understand that irregular bleeding is a very common side effect of this medication. She was advised to have backup contraception for one week after placement. Appropriate time out taken. Patient's left arm was prepped and draped in the usual sterile fashion. The insertion area was measured and marked. Patient was prepped with alcohol swab and then injected with 3 ml of 1% lidocaine. The area was then prepped with betadine. Nexplanon removed from packaging and device confirmed present within needle, then inserted full length of needle and withdrawn per handbook instructions. Nexplanon was able to palpated in the patient's arm; patient palpated the insert herself. There was minimal blood loss. Patient insertion site covered with steri strip, guaze, and a pressure bandage to reduce any bruising. The patient tolerated the procedure well and was given post procedure instructions.

## 2019-05-02 DIAGNOSIS — Z975 Presence of (intrauterine) contraceptive device: Secondary | ICD-10-CM

## 2019-05-02 DIAGNOSIS — Z30017 Encounter for initial prescription of implantable subdermal contraceptive: Secondary | ICD-10-CM

## 2019-05-02 MED ORDER — FERROUS FUMARATE 324 (106 FE) MG PO TABS: 1.0000 | ORAL_TABLET | Freq: Every day | ORAL | 2 refills | Status: DC

## 2019-05-02 MED ORDER — POLYETHYLENE GLYCOL 3350 17 GM/SCOOP PO POWD: 17.0000 g | Freq: Every day | ORAL | 0 refills | Status: DC | PRN

## 2019-05-02 MED ORDER — ACETAMINOPHEN 325 MG PO TABS: 650.0000 mg | ORAL_TABLET | ORAL | 0 refills | Status: DC | PRN

## 2019-05-02 MED ORDER — ACETAMINOPHEN 325 MG PO TABS
650.0000 mg | ORAL_TABLET | ORAL | Status: DC | PRN
  Administered 2019-05-02: 650 mg via ORAL
  Filled 2019-05-02: qty 2

## 2019-05-02 MED ORDER — IBUPROFEN 600 MG PO TABS: 600.0000 mg | ORAL_TABLET | Freq: Four times a day (QID) | ORAL | 0 refills | Status: DC | PRN

## 2019-05-02 MED ORDER — OXYCODONE HCL 5 MG PO TABS: 5.0000 mg | ORAL_TABLET | ORAL | 0 refills | Status: DC | PRN

## 2019-05-02 MED ORDER — IBUPROFEN 600 MG PO TABS
600.0000 mg | ORAL_TABLET | Freq: Four times a day (QID) | ORAL | Status: DC | PRN
  Administered 2019-05-02: 600 mg via ORAL
  Filled 2019-05-02: qty 1

## 2019-05-02 NOTE — Progress Notes (Signed)
Subjective: Postpartum Day 2: Cesarean Delivery Patient reports vaginal spotting, tolerating PO, + flatus, + BM and no problems voiding. Reports no visual changes or headache. Does report point tenderness around the umbilical and along incision site but pain is controllable with medication.     Objective: Vital signs in last 24 hours: Temp:  [97.9 F (36.6 C)-98.7 F (37.1 C)] 98.2 F (36.8 C) (04/09 0525) Pulse Rate:  [80-100] 80 (04/09 0525) Resp:  [16-20] 20 (04/09 0525) BP: (114-127)/(64-78) 114/65 (04/09 0525) SpO2:  [97 %-99 %] 97 % (04/09 0525)  Physical Exam:  General: alert, cooperative and fatigued Lochia: appropriate Uterine Fundus: firm Incision: healing well, no significant drainage DVT Evaluation: No evidence of DVT seen on physical exam. No significant calf/ankle edema.  Recent Labs    05/01/19 0519  HGB 8.7*  HCT 27.6*    Assessment/Plan: Status post Cesarean section. Doing well postoperatively. Circumcision and contraception (nexplanon) completed yesterday. Discharge home with standard precautions and return to clinic in 4-6 weeks.  [name] MS3 05/02/2019, 8:00 AM

## 2019-05-02 NOTE — Discharge Instructions (Signed)
Cesarean Delivery, Care After This sheet gives you information about how to care for yourself after your procedure. Your health care provider may also give you more specific instructions. If you have problems or questions, contact your health care provider. What can I expect after the procedure? After the procedure, it is common to have:  A small amount of blood or clear fluid coming from the incision.  Some redness, swelling, and pain in your incision area.  Some abdominal pain and soreness.  Vaginal bleeding (lochia). Even though you did not have a vaginal delivery, you will still have vaginal bleeding and discharge.  Pelvic cramps.  Fatigue. You may have pain, swelling, and discomfort in the tissue between your vagina and your anus (perineum) if:  Your C-section was unplanned, and you were allowed to labor and push.  An incision was made in the area (episiotomy) or the tissue tore during attempted vaginal delivery. Follow these instructions at home: Incision care   Follow instructions from your health care provider about how to take care of your incision. Make sure you: ? Wash your hands with soap and water before you change your bandage (dressing). If soap and water are not available, use hand sanitizer. ? If you have a dressing, change it or remove it as told by your health care provider. ? Leave stitches (sutures), skin staples, skin glue, or adhesive strips in place. These skin closures may need to stay in place for 2 weeks or longer. If adhesive strip edges start to loosen and curl up, you may trim the loose edges. Do not remove adhesive strips completely unless your health care provider tells you to do that.  Check your incision area every day for signs of infection. Check for: ? More redness, swelling, or pain. ? More fluid or blood. ? Warmth. ? Pus or a bad smell.  Do not take baths, swim, or use a hot tub until your health care provider says it's okay. Ask your health  care provider if you can take showers.  When you cough or sneeze, hug a pillow. This helps with pain and decreases the chance of your incision opening up (dehiscing). Do this until your incision heals. Medicines  Take over-the-counter and prescription medicines only as told by your health care provider.  If you were prescribed an antibiotic medicine, take it as told by your health care provider. Do not stop taking the antibiotic even if you start to feel better.  Do not drive or use heavy machinery while taking prescription pain medicine. Lifestyle  Do not drink alcohol. This is especially important if you are breastfeeding or taking pain medicine.  Do not use any products that contain nicotine or tobacco, such as cigarettes, e-cigarettes, and chewing tobacco. If you need help quitting, ask your health care provider. Eating and drinking  Drink at least 8 eight-ounce glasses of water every day unless told not to by your health care provider. If you breastfeed, you may need to drink even more water.  Eat high-fiber foods every day. These foods may help prevent or relieve constipation. High-fiber foods include: ? Whole grain cereals and breads. ? Brown rice. ? Beans. ? Fresh fruits and vegetables. Activity   If possible, have someone help you care for your baby and help with household activities for at least a few days after you leave the hospital.  Return to your normal activities as told by your health care provider. Ask your health care provider what activities are safe for   you.  Rest as much as possible. Try to rest or take a nap while your baby is sleeping.  Do not lift anything that is heavier than 10 lbs (4.5 kg), or the limit that you were told, until your health care provider says that it is safe.  Talk with your health care provider about when you can engage in sexual activity. This may depend on your: ? Risk of infection. ? How fast you heal. ? Comfort and desire to  engage in sexual activity. General instructions  Do not use tampons or douches until your health care provider approves.  Wear loose, comfortable clothing and a supportive and well-fitting bra.  Keep your perineum clean and dry. Wipe from front to back when you use the toilet.  If you pass a blood clot, save it and call your health care provider to discuss. Do not flush blood clots down the toilet before you get instructions from your health care provider.  Keep all follow-up visits for you and your baby as told by your health care provider. This is important. Contact a health care provider if:  You have: ? A fever. ? Bad-smelling vaginal discharge. ? Pus or a bad smell coming from your incision. ? Difficulty or pain when urinating. ? A sudden increase or decrease in the frequency of your bowel movements. ? More redness, swelling, or pain around your incision. ? More fluid or blood coming from your incision. ? A rash. ? Nausea. ? Little or no interest in activities you used to enjoy. ? Questions about caring for yourself or your baby.  Your incision feels warm to the touch.  Your breasts turn red or become painful or hard.  You feel unusually sad or worried.  You vomit.  You pass a blood clot from your vagina.  You urinate more than usual.  You are dizzy or light-headed. Get help right away if:  You have: ? Pain that does not go away or get better with medicine. ? Chest pain. ? Difficulty breathing. ? Blurred vision or spots in your vision. ? Thoughts about hurting yourself or your baby. ? New pain in your abdomen or in one of your legs. ? A severe headache.  You faint.  You bleed from your vagina so much that you fill more than one sanitary pad in one hour. Bleeding should not be heavier than your heaviest period. Summary  After the procedure, it is common to have pain at your incision site, abdominal cramping, and slight bleeding from your vagina.  Check  your incision area every day for signs of infection.  Tell your health care provider about any unusual symptoms.  Keep all follow-up visits for you and your baby as told by your health care provider. This information is not intended to replace advice given to you by your health care provider. Make sure you discuss any questions you have with your health care provider. Document Revised: 07/18/2017 Document Reviewed: 07/18/2017 Elsevier Patient Education  2020 [employer]vier Inc.  

## 2019-05-06 ENCOUNTER — Encounter: Payer: Self-pay | Admitting: Advanced Practice Midwife

## 2019-05-14 ENCOUNTER — Ambulatory Visit (INDEPENDENT_AMBULATORY_CARE_PROVIDER_SITE_OTHER): Payer: Medicaid Other | Admitting: Advanced Practice Midwife

## 2019-05-14 ENCOUNTER — Other Ambulatory Visit: Payer: Self-pay

## 2019-05-14 ENCOUNTER — Encounter: Payer: Self-pay | Admitting: Advanced Practice Midwife

## 2019-05-14 VITALS — BP 116/77 | HR 85 | Temp 98.1°F | Wt 154.0 lb

## 2019-05-14 DIAGNOSIS — Z4889 Encounter for other specified surgical aftercare: Secondary | ICD-10-CM

## 2019-05-14 NOTE — Progress Notes (Signed)
   POSTOPERATIVE incision NOTE   Subjective:     Tammie Diaz is a 24 y.o. G2P2002 who presents to the clinic weeks status post repeat c-section on 04/30/2019. Eating a regular diet without difficulty. Bowel movements are normal. The patient is not having any pain.  The following portions of the patient's history were reviewed and updated as appropriate: allergies, current medications, past family history, past medical history, past social history, past surgical history and problem list.  Review of Systems Pertinent items are noted in HPI.    Objective:    BP 116/77 (BP Location: Right Arm, Patient Position: Sitting, Cuff Size: Normal)   Pulse 85   Temp 98.1 F (36.7 C) (Oral)   Wt 154 lb (69.9 kg)   LMP 07/18/2018   Breastfeeding No   BMI 28.17 kg/m  General:  alert, cooperative and no distress  Abdomen: soft, non-tender  Incision:   healing well, no drainage, no erythema, incision well approximated      Assessment:   Doing well postoperatively.  Plan:   1. Continue any current medications. 2.  FU for planned PP visit    Thressa Sheller DNP, CNM  05/14/19  10:12 AM

## 2019-06-11 ENCOUNTER — Ambulatory Visit: Payer: Medicaid Other | Admitting: Obstetrics and Gynecology

## 2019-06-12 ENCOUNTER — Telehealth: Payer: Medicaid Other | Admitting: Obstetrics and Gynecology

## 2019-06-18 ENCOUNTER — Encounter: Payer: Self-pay | Admitting: Obstetrics and Gynecology

## 2019-06-18 ENCOUNTER — Other Ambulatory Visit: Payer: Self-pay

## 2019-06-18 ENCOUNTER — Ambulatory Visit (INDEPENDENT_AMBULATORY_CARE_PROVIDER_SITE_OTHER): Payer: Medicaid Other | Admitting: Obstetrics and Gynecology

## 2019-06-18 DIAGNOSIS — Z3009 Encounter for other general counseling and advice on contraception: Secondary | ICD-10-CM

## 2019-06-18 DIAGNOSIS — O24439 Gestational diabetes mellitus in the puerperium, unspecified control: Secondary | ICD-10-CM

## 2019-06-18 NOTE — Progress Notes (Signed)
Post Partum Visit Note  Tammie Diaz is a 24 y.o. G29P2002 female who presents for a postpartum visit. She is 7 weeks postpartum following a primary cesarean section.  I have fully reviewed the prenatal and intrapartum course. The delivery was at 39 gestational weeks.  Anesthesia: spinal. Postpartum course has been uncomplicated. Baby "Para March" is doing well and gaining weight appropriately. Baby is feeding by bottle - Similac Neosure. Bleeding staining only. Bowel function is normal. Bladder function is normal. Patient is not sexually active. Contraception method is Nexplanon. Postpartum depression screening: negative.  The following portions of the patient's history were reviewed and updated as appropriate: allergies, current medications, past family history, past medical history, past social history, past surgical history and problem list.  Review of Systems Constitutional: negative Eyes: negative Ears, nose, mouth, throat, and face: negative Respiratory: negative Cardiovascular: negative Gastrointestinal: negative Genitourinary:negative Integument/breast: negative Hematologic/lymphatic: negative Musculoskeletal:negative Neurological: negative Behavioral/Psych: negative Endocrine: negative Allergic/Immunologic: negative    Objective:  Blood pressure 108/73, pulse 64, temperature 98.1 F (36.7 C), temperature source Oral, weight 158 lb (71.7 kg), last menstrual period 07/18/2018, not currently breastfeeding.  General:  alert, cooperative and no distress   Breasts:  inspection negative, no nipple discharge or bleeding, no masses or nodularity palpable  Lungs: clear to auscultation bilaterally  Heart:  regular rate and rhythm, S1, S2 normal, no murmur, click, rub or gallop  Abdomen: soft, non-tender; bowel sounds normal; no masses,  no organomegaly   Vulva:  not evaluated  Vagina: not evaluated  Cervix:  not evaluated  Corpus: not examined  Adnexa:  not evaluated  Rectal  Exam: Not performed.        Assessment:  Encounter for postpartum visit - Normal postpartum exam.  - Pap smear not done at today's visit.   Gestational diabetes mellitus (GDM), postpartum  - Glucose tolerance, 2 hours,    Plan:   Essential components of care per ACOG recommendations:  1.  Mood and well being: Patient with negative depression screening today. Reviewed local resources for support.  - Patient does not use tobacco. If using tobacco we discussed reduction and for recently cessation risk of relapse - hx of drug use? No  If yes, discussed support systems  2. Infant care and feeding:  -Patient currently breastmilk feeding? No If breastmilk feeding discussed return to work and pumping. If needed, patient was provided letter for work to allow for every 2-3 hr pumping breaks, and to be granted a private location to express breastmilk and refrigerated area to store breastmilk. Reviewed importance of draining breast regularly to support lactation. -Social determinants of health (SDOH) reviewed in EPIC. No concerns  3. Sexuality, contraception and birth spacing - Patient does not want a pregnancy in the next year.  Desired family size is 3 children.  - Reviewed forms of contraception in tiered fashion. Patient desired Nexplanon today.   - Discussed birth spacing of 18 months  4. Sleep and fatigue -Encouraged family/partner/community support of 4 hrs of uninterrupted sleep to help with mood and fatigue  5. Physical Recovery  - Discussed patients delivery and complications - Patient had a repeat cesarean delivery - Patient has urinary incontinence? No Patient was referred to pelvic floor PT  - Patient is safe to resume physical and sexual activity  6.  Health Maintenance - Last pap smear done 10/30/2018 and was normal with negative HPV. - Mammogram > N/A  7. No Chronic Disease - PCP follow up  Raelyn Mora, [facility]  for [employer], [place]

## 2019-06-19 LAB — GLUCOSE TOLERANCE, 2 HOURS
Glucose, 2 hour: 68 mg/dL (ref 65–139)
Glucose, GTT - Fasting: 77 mg/dL (ref 65–99)

## 2019-07-25 ENCOUNTER — Ambulatory Visit: Payer: Medicaid Other | Admitting: Obstetrics and Gynecology

## 2020-10-05 IMAGING — US US MFM OB FOLLOW-UP
1 series · 14 of 28 positions shown · non-contrast
Comparison: none

[Series 1: us mfm ob follow-up · 31 acquisitions, 14 frames shown]
[im 2/31]
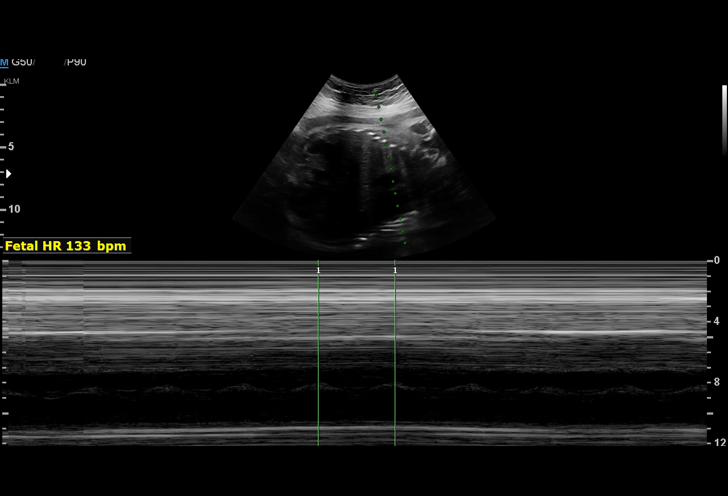
[im 4/31]
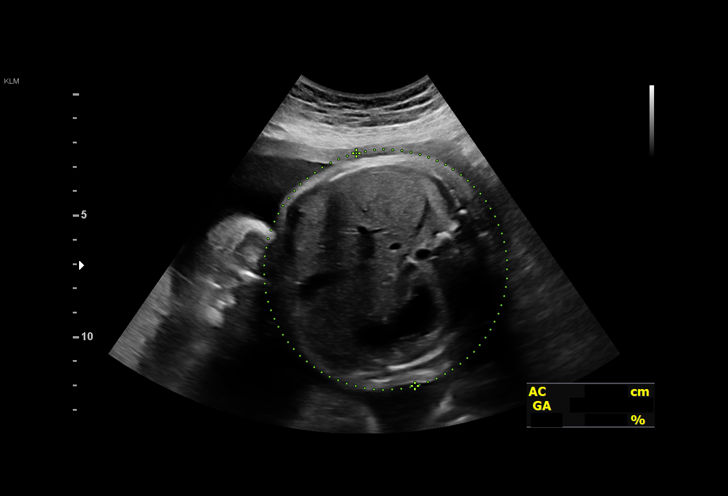
[im 6/31]
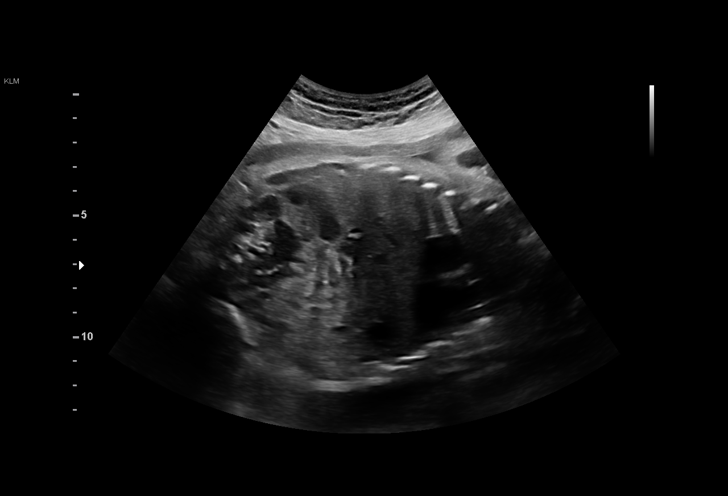
[im 8/31]
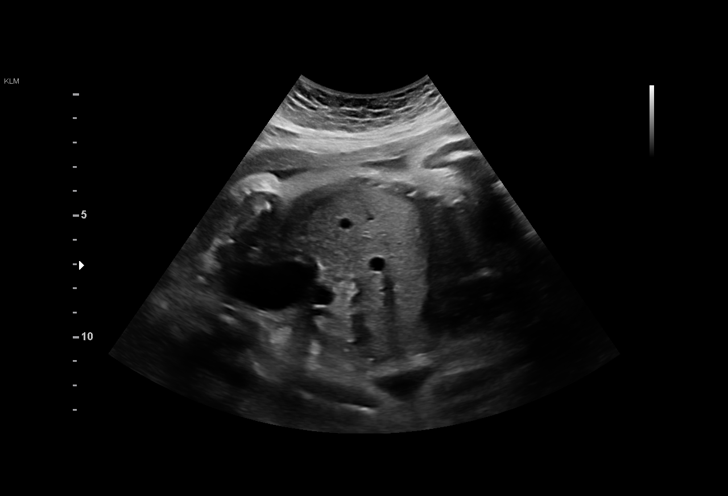
[im 11/31]
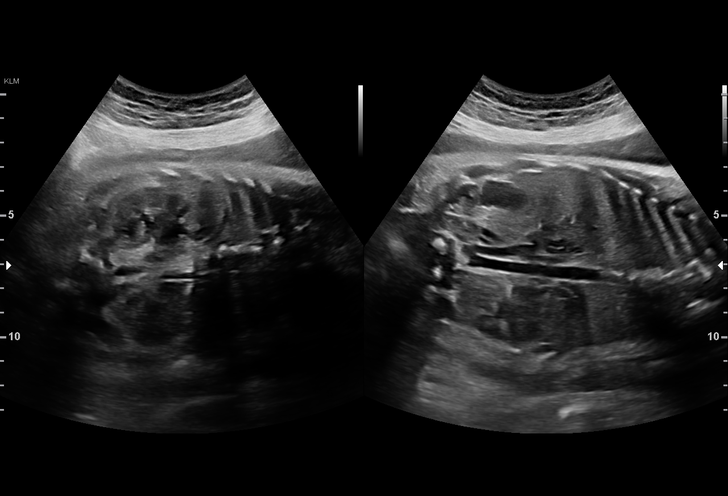
[im 13/31]
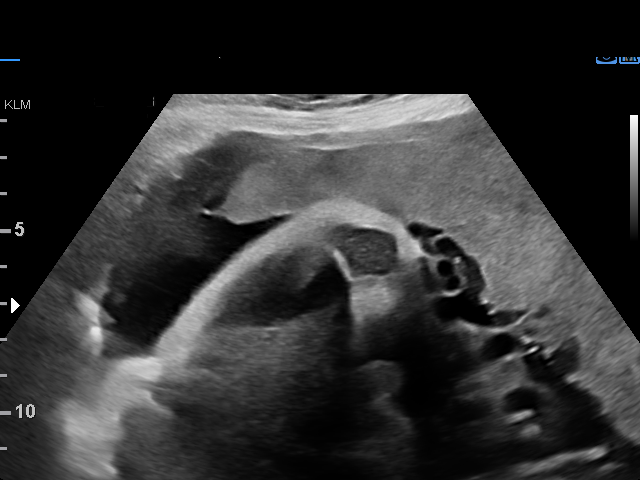
[im 15/31]
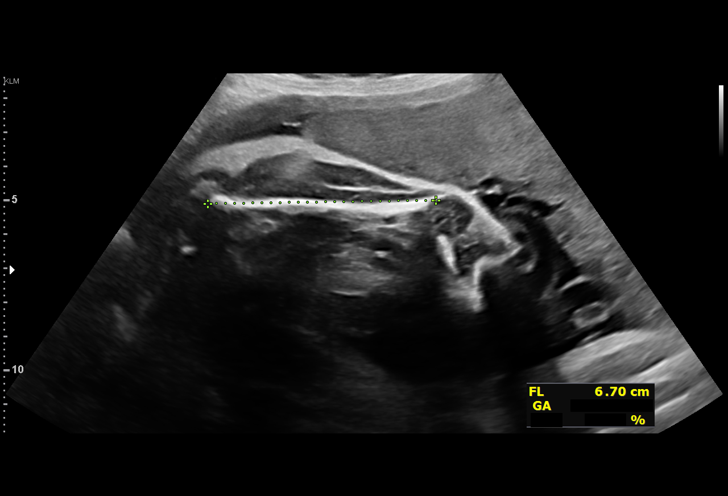
[im 17/31]
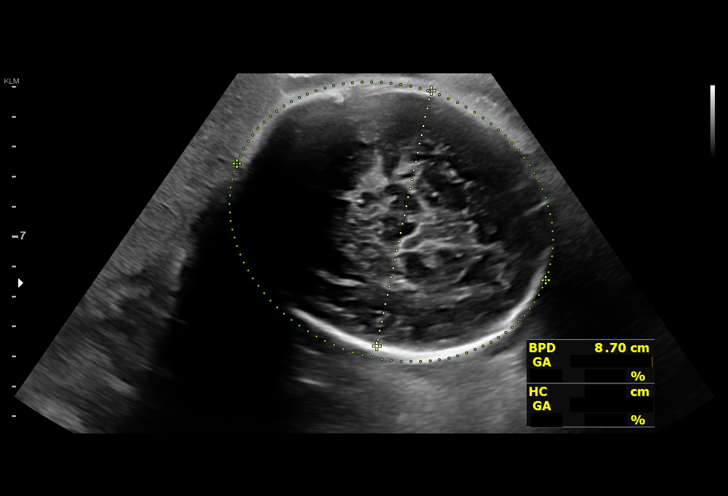
[im 19/31]
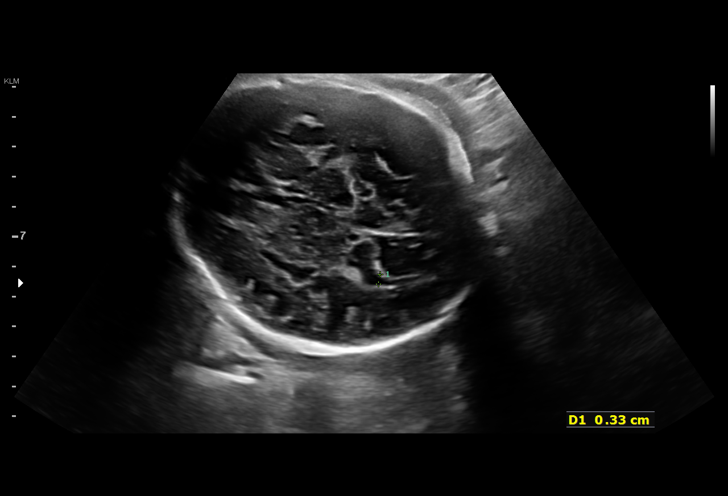
[im 22/31]
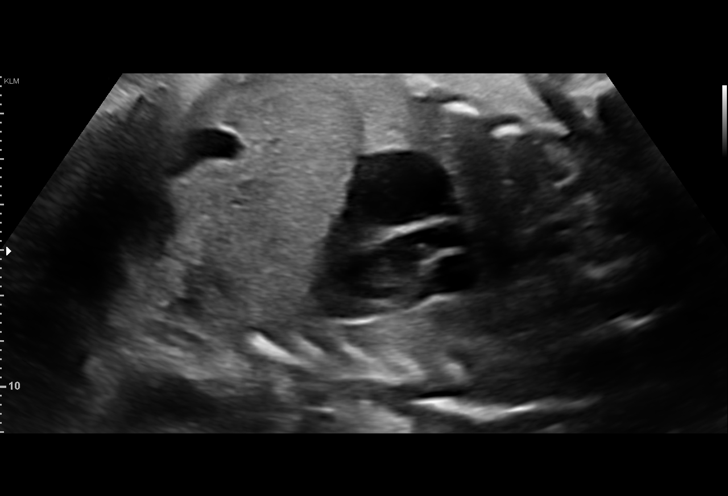
[im 24/31]
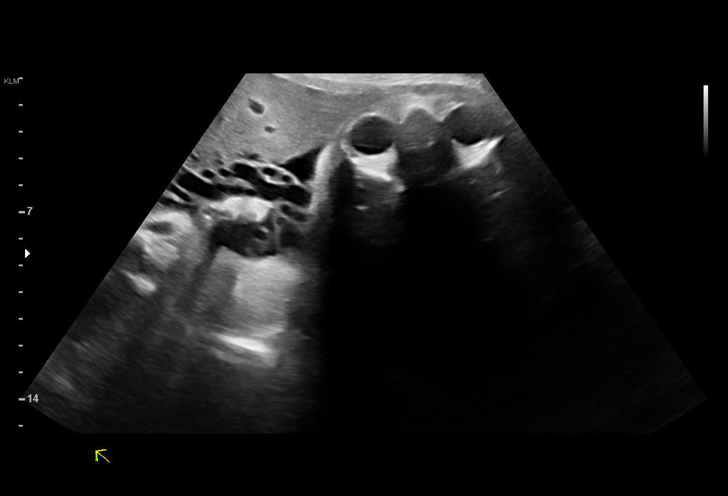
[im 26/31]
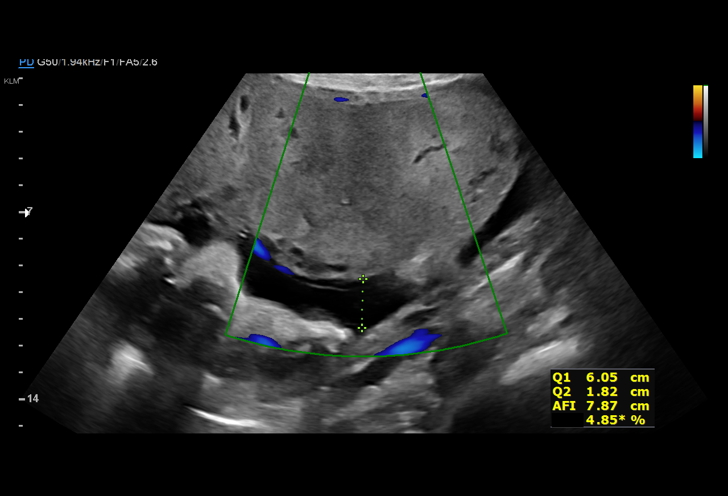
[im 28/31]
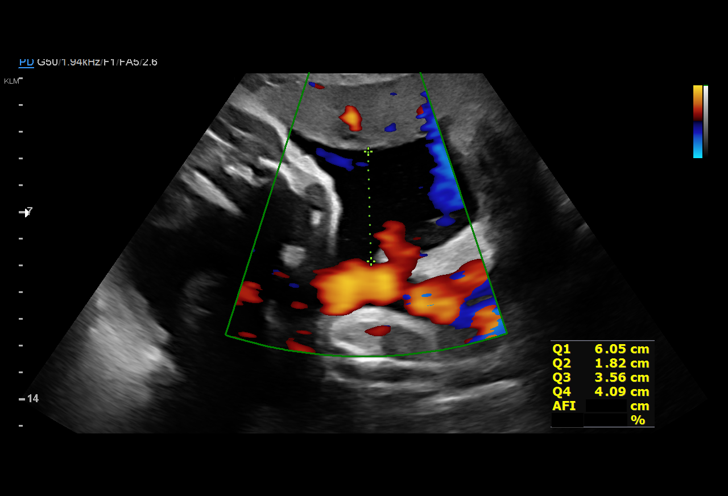
[im 31/31]
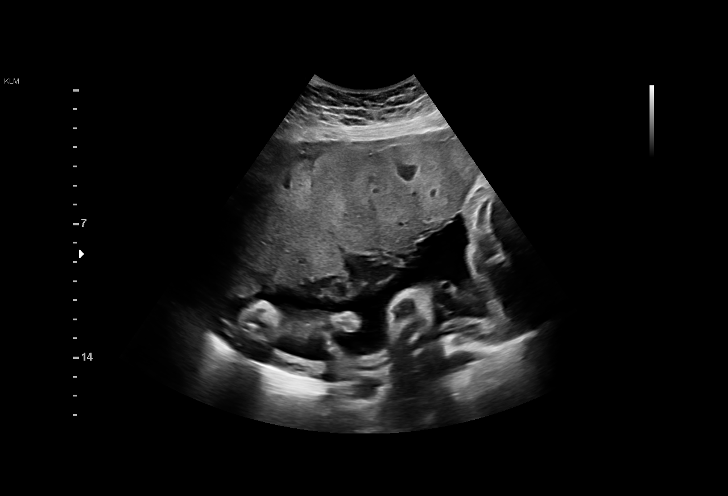

[14 of 28 positions shown; findings below may reference images not displayed]

CNM

 ----------------------------------------------------------------------

 ----------------------------------------------------------------------
Indications

  Gestational diabetes in pregnancy, diet
  controlled
  34 weeks gestation of pregnancy
  Antenatal follow-up for nonvisualized fetal
  anatomy
  Uterine size-date discrepancy, third trimester
  Previous cesarean delivery, antepartum
 ----------------------------------------------------------------------
Fetal Evaluation

 Num Of Fetuses:         1
 Fetal Heart Rate(bpm):  133
 Cardiac Activity:       Observed
 Presentation:           Cephalic
 Placenta:               Anterior
 P. Cord Insertion:      Previously Visualized

 Amniotic Fluid
 AFI FV:      Within normal limits

 AFI Sum(cm)     %Tile       Largest Pocket(cm)
 15.52           56

 RUQ(cm)       RLQ(cm)       LUQ(cm)        LLQ(cm)

Biometry

 BPD:      86.9  mm     G. Age:  35w 1d         58  %    CI:        75.77   %    70 - 86
                                                         FL/HC:      20.3   %    20.1 -
 HC:      316.5  mm     G. Age:  35w 4d         32  %    HC/AC:      1.02        0.93 -
 AC:      309.1  mm     G. Age:  34w 6d         56  %    FL/BPD:     73.9   %    71 - 87
 FL:       64.2  mm     G. Age:  33w 1d          8  %    FL/AC:      20.8   %    20 - 24
 Est. FW:    5665  gm      5 lb 6 oz     35  %
OB History

 Gravidity:    2         Term:   1        Prem:   0        SAB:   0
 TOP:          0       Ectopic:  0        Living: 1
Gestational Age

 U/S Today:     34w 5d                                        EDD:   05/08/19
 Best:          34w 6d     Det. By:  Early Ultrasound         EDD:   05/07/19
                                     (09/29/18)
Anatomy

 Cranium:               Appears normal         Aortic Arch:            Previously seen
 Cavum:                 Appears normal         Ductal Arch:            Previously seen
 Ventricles:            Appears normal         Diaphragm:              Appears normal
 Choroid Plexus:        Previously seen        Stomach:                Appears normal, left
                                                                       sided
 Cerebellum:            Previously seen        Abdomen:                Appears normal
 Posterior Fossa:       Previously seen        Abdominal Wall:         Previously seen
 Nuchal Fold:           Previously seen        Cord Vessels:           Previously seen
 Face:                  Orbits and profile     Kidneys:                Appear normal
                        previously seen
 Lips:                  Previously seen        Bladder:                Appears normal
 Thoracic:              Appears normal         Spine:                  Previously seen
 Heart:                 Previously seen        Upper Extremities:      Previously seen
 RVOT:                  Appears normal         Lower Extremities:      Previously seen
 LVOT:                  Appears normal

 Other:  Fetus appears to be a male. Heels previously visualized. Right 5th
         previously visualized. Nasal bone previously visualized.
Cervix Uterus Adnexa

 Cervix
 Not visualized (advanced GA >70wks)
Impression

 Follow up growth for size and date discrepancy
 HCDH0
 Good fetal movement and amniotic fluid
Recommendations

 Follow up as clinically or if Ms. Badachi requires therapy
 please initiate weekly testing and repeat growth in 4 week
 from today.

## 2022-05-11 ENCOUNTER — Ambulatory Visit: Payer: Medicaid Other | Admitting: Obstetrics and Gynecology

## 2022-05-11 ENCOUNTER — Encounter: Payer: Self-pay | Admitting: Obstetrics and Gynecology

## 2022-05-11 ENCOUNTER — Other Ambulatory Visit: Payer: Self-pay

## 2022-05-11 VITALS — BP 115/79 | HR 84 | Wt 171.4 lb

## 2022-05-11 DIAGNOSIS — Z3046 Encounter for surveillance of implantable subdermal contraceptive: Secondary | ICD-10-CM

## 2022-05-11 HISTORY — PX: REMOVAL OF IMPLANON ROD: OBO 1006

## 2022-05-11 MED ORDER — FOLIC ACID 1 MG PO TABS: 1.0000 mg | ORAL_TABLET | Freq: Every day | ORAL | 10 refills | Status: DC

## 2022-05-11 NOTE — Procedures (Addendum)
Nexplanon Removal Procedure Note Nexplanon expires this month and patient desires to conceive. Patient on period currently and she states she's had qmonth periods with it in place.   After informed consent was obtained, the patient's left arm was examined and the Nexplanon rod was noted to be easily palpable. The area was cleaned with alcohol then local anesthesia was infiltrated with 3 ml of 1% lidocaine with epinephrine. The area was prepped with betadine. Using sterile technique, an 11 blade was used to make an incision, and the Nexplanon device was brought to the incision site. The capsule was scrapped off with the scalpel, the Nexplanon grasped with hemostats, and easily removed; the removal site was hemostatic. The Nexplanon was inspected and noted to be intact.  A steri-strip and a pressure dressing was applied.  The patient tolerated the procedure well. Recommended to patient she start folic acid. Patient on period currently and declines pap. Patient told to make appointment for pap at check out.    Cornelia Copa MD Attending Center for [employer] [facility]wife)

## 2022-05-11 NOTE — Progress Notes (Signed)
See procedure note for nexplanon removal °

## 2022-06-15 ENCOUNTER — Ambulatory Visit: Payer: Medicaid Other | Admitting: Obstetrics and Gynecology

## 2022-08-17 ENCOUNTER — Ambulatory Visit (INDEPENDENT_AMBULATORY_CARE_PROVIDER_SITE_OTHER): Payer: Medicaid Other | Admitting: *Deleted

## 2022-08-17 DIAGNOSIS — O219 Vomiting of pregnancy, unspecified: Secondary | ICD-10-CM

## 2022-08-17 DIAGNOSIS — O3680X0 Pregnancy with inconclusive fetal viability, not applicable or unspecified: Secondary | ICD-10-CM

## 2022-08-17 DIAGNOSIS — Z3200 Encounter for pregnancy test, result unknown: Secondary | ICD-10-CM

## 2022-08-17 DIAGNOSIS — Z3201 Encounter for pregnancy test, result positive: Secondary | ICD-10-CM | POA: Diagnosis not present

## 2022-08-17 DIAGNOSIS — O0990 Supervision of high risk pregnancy, unspecified, unspecified trimester: Secondary | ICD-10-CM

## 2022-08-17 LAB — POCT PREGNANCY, URINE: Preg Test, Ur: POSITIVE — AB

## 2022-08-17 MED ORDER — PROMETHAZINE HCL 25 MG PO TABS
25.0000 mg | ORAL_TABLET | Freq: Four times a day (QID) | ORAL | 0 refills | Status: DC | PRN
Start: 2022-08-17 — End: 2022-09-06

## 2022-08-17 NOTE — Progress Notes (Signed)
Pt submitted urine for pregnancy test which is positive. I called her and provided results.  She reports Nexplanon was removed on 05/11/22 followed by sure LMP 05/18/22. This yields EDD 02/22/23, now [redacted]w[redacted]d. She c/o nausea and some vomiting - phenergan prescribed per standing order. Pt denies vaginal bleeding or abdominal pain. She was advised to go to [facility] if she develops either of these Sx. Pt scheduled for dating ultrasound on 8/1 per request Dr. Para March. She was informed of the ultrasound appt and that she will be scheduled for prenatal care. She voiced understanding and had no questions.

## 2022-08-24 ENCOUNTER — Ambulatory Visit: Payer: Medicaid Other

## 2022-08-24 DIAGNOSIS — O0991 Supervision of high risk pregnancy, unspecified, first trimester: Secondary | ICD-10-CM | POA: Diagnosis not present

## 2022-08-24 DIAGNOSIS — Z3A10 10 weeks gestation of pregnancy: Secondary | ICD-10-CM

## 2022-08-24 DIAGNOSIS — O0990 Supervision of high risk pregnancy, unspecified, unspecified trimester: Secondary | ICD-10-CM

## 2022-08-24 DIAGNOSIS — O3680X0 Pregnancy with inconclusive fetal viability, not applicable or unspecified: Secondary | ICD-10-CM

## 2022-09-06 ENCOUNTER — Encounter: Payer: Self-pay | Admitting: General Practice

## 2022-09-06 ENCOUNTER — Telehealth: Payer: Medicaid Other | Admitting: General Practice

## 2022-09-06 DIAGNOSIS — Z3A00 Weeks of gestation of pregnancy not specified: Secondary | ICD-10-CM

## 2022-09-06 DIAGNOSIS — Z8632 Personal history of gestational diabetes: Secondary | ICD-10-CM

## 2022-09-06 DIAGNOSIS — Z3490 Encounter for supervision of normal pregnancy, unspecified, unspecified trimester: Secondary | ICD-10-CM | POA: Insufficient documentation

## 2022-09-06 DIAGNOSIS — Z98891 History of uterine scar from previous surgery: Secondary | ICD-10-CM | POA: Insufficient documentation

## 2022-09-06 DIAGNOSIS — Z3491 Encounter for supervision of normal pregnancy, unspecified, first trimester: Secondary | ICD-10-CM

## 2022-09-06 DIAGNOSIS — O34219 Maternal care for unspecified type scar from previous cesarean delivery: Secondary | ICD-10-CM

## 2022-09-06 MED ORDER — BLOOD PRESSURE KIT DEVI
1.0000 | Freq: Once | 0 refills | Status: AC
Start: 2022-09-06 — End: 2022-09-06

## 2022-09-06 NOTE — Progress Notes (Signed)
New OB Intake  I connected with Tammie Diaz  on 09/06/22 at  2:15 PM EDT by telephone  and verified that I am speaking with the correct person using two identifiers. Nurse is located at [facility] and pt is located at home.  I discussed the limitations, risks, security and privacy concerns of performing an evaluation and management service by telephone and the availability of in person appointments. I also discussed with the patient that there may be a patient responsible charge related to this service. The patient expressed understanding and agreed to proceed.  I explained I am completing New OB Intake today. We discussed EDD of 03/17/2023, by Ultrasound. Pt is G3P2002. I reviewed her allergies, medications and Medical/Surgical/OB history.    Patient Active Problem List   Diagnosis Date Noted   Supervision of low-risk pregnancy 09/06/2022   History of C-section 09/06/2022   History of gestational diabetes 09/06/2022    Concerns addressed today None   Delivery Plans Plans to deliver at [facility]. Discussed the nature of our practice with multiple providers including residents and students. Due to the size of the practice, the delivering provider may not be the same as those providing prenatal care.   Patient a candidate for water birth- plans repeat C-Section.   MyChart/Babyscripts MyChart access verified. I explained pt will have some visits in office and some virtually. Babyscripts instructions given and order placed. Patient verifies receipt of registration text/e-mail. Account successfully created and app downloaded.  Blood Pressure Cuff/Weight Scale Blood pressure cuff ordered for patient to pick-up from [employer]. Explained after first prenatal appt pt will check weekly and document in Babyscripts. Patient does not have weight scale; patient may purchase if they desire to track weight weekly in Babyscripts.  Anatomy US Explained first scheduled [place] will be around 19 weeks.  Anatomy US scheduled for October 1 at 8:15am.  Is patient a CenteringPregnancy candidate?  Declined due to Childcare but is strongly interested. Provided group dates & she is going to check her schedule.   Is patient a Mom+Baby Combined Care candidate?  Not a candidate    Is patient a candidate for Babyscripts Optimization? No - going to try to do Centering  First visit review I reviewed new OB appt with patient. Explained pt will be seen by Dr Vergie Living at first visit. Discussed Avelina Laine genetic screening with patient. Patient would like Panorama collected- Horizon not needed. Routine prenatal labs  will be done at new OB visit next week    Last Pap Diagnosis  Date Value Ref Range Status  10/30/2018   Final   - Negative for intraepithelial lesion or malignancy (NILM)  10/30/2018 Molecular only (A)  Final    Marylynn Pearson, RN 09/06/2022  2:46 PM

## 2022-09-14 ENCOUNTER — Encounter: Payer: Medicaid Other | Admitting: Obstetrics and Gynecology

## 2022-09-14 ENCOUNTER — Encounter: Payer: Self-pay | Admitting: Obstetrics and Gynecology

## 2022-10-05 ENCOUNTER — Other Ambulatory Visit (HOSPITAL_COMMUNITY)
Admission: RE | Admit: 2022-10-05 | Discharge: 2022-10-05 | Disposition: A | Discharge status: Home / Self Care | Payer: Medicaid Other | Source: Ambulatory Visit | Attending: Obstetrics and Gynecology | Admitting: Obstetrics and Gynecology

## 2022-10-05 ENCOUNTER — Other Ambulatory Visit: Payer: Self-pay

## 2022-10-05 ENCOUNTER — Ambulatory Visit (INDEPENDENT_AMBULATORY_CARE_PROVIDER_SITE_OTHER): Payer: Medicaid Other | Admitting: Family Medicine

## 2022-10-05 ENCOUNTER — Encounter: Payer: Self-pay | Admitting: Family Medicine

## 2022-10-05 VITALS — BP 109/74 | HR 92 | Wt 170.2 lb

## 2022-10-05 DIAGNOSIS — Z98891 History of uterine scar from previous surgery: Secondary | ICD-10-CM | POA: Diagnosis not present

## 2022-10-05 DIAGNOSIS — Z113 Encounter for screening for infections with a predominantly sexual mode of transmission: Secondary | ICD-10-CM | POA: Diagnosis present

## 2022-10-05 DIAGNOSIS — Z3A16 16 weeks gestation of pregnancy: Secondary | ICD-10-CM | POA: Diagnosis not present

## 2022-10-05 DIAGNOSIS — Z8632 Personal history of gestational diabetes: Secondary | ICD-10-CM

## 2022-10-05 DIAGNOSIS — Z3492 Encounter for supervision of normal pregnancy, unspecified, second trimester: Secondary | ICD-10-CM | POA: Diagnosis present

## 2022-10-05 DIAGNOSIS — O99891 Other specified diseases and conditions complicating pregnancy: Secondary | ICD-10-CM

## 2022-10-05 DIAGNOSIS — Z124 Encounter for screening for malignant neoplasm of cervix: Secondary | ICD-10-CM | POA: Diagnosis present

## 2022-10-05 DIAGNOSIS — O219 Vomiting of pregnancy, unspecified: Secondary | ICD-10-CM

## 2022-10-05 DIAGNOSIS — R8271 Bacteriuria: Secondary | ICD-10-CM

## 2022-10-05 MED ORDER — ONDANSETRON 4 MG PO TBDP
4.0000 mg | ORAL_TABLET | Freq: Four times a day (QID) | ORAL | 5 refills | Status: DC | PRN
Start: 2022-10-05 — End: 2022-11-14

## 2022-10-05 NOTE — Patient Instructions (Signed)
 Second Trimester of Pregnancy  The second trimester of pregnancy is from week 13 through week 27. This is months 4 through 6 of pregnancy. The second trimester is often a time when you feel your best. Your body has adjusted to being pregnant, and you begin to feel better physically. During the second trimester: Morning sickness has lessened or stopped completely. You may have more energy. You may have an increase in appetite. The second trimester is also a time when the unborn baby (fetus) is growing rapidly. At the end of the sixth month, the fetus may be up to 12 inches long and weigh about 1 pounds. You will likely begin to feel the baby move (quickening) between 16 and 20 weeks of pregnancy. Body changes during your second trimester Your body continues to go through many changes during your second trimester. The changes vary and generally return to normal after the baby is born. Physical changes Your weight will continue to increase. You will notice your lower abdomen bulging out. You may begin to get stretch marks on your hips, abdomen, and breasts. Your breasts will continue to grow and to become tender. Dark spots or blotches (chloasma or mask of pregnancy) may develop on your face. A dark line from your belly button to the pubic area (linea nigra) may appear. You may have changes in your hair. These can include thickening of your hair, rapid growth, and changes in texture. Some people also have hair loss during or after pregnancy, or hair that feels dry or thin. Health changes You may develop headaches. You may have heartburn. You may develop constipation. You may develop hemorrhoids or swollen, bulging veins (varicose veins). Your gums may bleed and may be sensitive to brushing and flossing. You may urinate more often because the fetus is pressing on your bladder. You may have back pain. This is caused by: Weight gain. Pregnancy hormones that are relaxing the joints in your  pelvis. A shift in weight and the muscles that support your balance. Follow these instructions at home: Medicines Follow your health care provider's instructions regarding medicine use. Specific medicines may be either safe or unsafe to take during pregnancy. Do not take any medicines unless approved by your health care provider. Take a prenatal vitamin that contains at least 600 micrograms (mcg) of folic acid. Eating and drinking Eat a healthy diet that includes fresh fruits and vegetables, whole grains, good sources of protein such as meat, eggs, or tofu, and low-fat dairy products. Avoid raw meat and unpasteurized juice, milk, and cheese. These carry germs that can harm you and your baby. You may need to take these actions to prevent or treat constipation: Drink enough fluid to keep your urine pale yellow. Eat foods that are high in fiber, such as beans, whole grains, and fresh fruits and vegetables. Limit foods that are high in fat and processed sugars, such as fried or sweet foods. Activity Exercise only as directed by your health care provider. Most people can continue their usual exercise routine during pregnancy. Try to exercise for 30 minutes at least 5 days a week. Stop exercising if you develop contractions in your uterus. Stop exercising if you develop pain or cramping in the lower abdomen or lower back. Avoid exercising if it is very hot or humid or if you are at a high altitude. Avoid heavy lifting. If you choose to, you may have sex unless your health care provider tells you not to. Relieving pain and discomfort Wear a supportive  bra to prevent discomfort from breast tenderness. Take warm sitz baths to soothe any pain or discomfort caused by hemorrhoids. Use hemorrhoid cream if your health care provider approves. Rest with your legs raised (elevated) if you have leg cramps or low back pain. If you develop varicose veins: Wear support hose as told by your health care  provider. Elevate your feet for 15 minutes, 3-4 times a day. Limit salt in your diet. Safety Wear your seat belt at all times when driving or riding in a car. Talk with your health care provider if someone is verbally or physically abusive to you. Lifestyle Do not use hot tubs, steam rooms, or saunas. Do not douche. Do not use tampons or scented sanitary pads. Avoid cat litter boxes and soil used by cats. These carry germs that can cause birth defects in the baby and possibly loss of the fetus by miscarriage or stillbirth. Do not use herbal remedies, alcohol, illegal drugs, or medicines that are not approved by your health care provider. Chemicals in these products can harm your baby. Do not use any products that contain nicotine or tobacco, such as cigarettes, e-cigarettes, and chewing tobacco. If you need help quitting, ask your health care provider. General instructions During a routine prenatal visit, your health care provider will do a physical exam and other tests. He or she will also discuss your overall health. Keep all follow-up visits. This is important. Ask your health care provider for a referral to a local prenatal education class. Ask for help if you have counseling or nutritional needs during pregnancy. Your health care provider can offer advice or refer you to specialists for help with various needs. Where to find more information American Pregnancy Association: americanpregnancy.org [employer]necologists: https://www.todd-brady.net/ Office on [employer] Health: MightyReward.co.nz Contact a health care provider if you have: A headache that does not go away when you take medicine. Vision changes or you see spots in front of your eyes. Mild pelvic cramps, pelvic pressure, or nagging pain in the abdominal area. Persistent nausea, vomiting, or diarrhea. A bad-smelling vaginal discharge or foul-smelling urine. Pain when you  urinate. Sudden or extreme swelling of your face, hands, ankles, feet, or legs. A fever. Get help right away if you: Have fluid leaking from your vagina. Have spotting or bleeding from your vagina. Have severe abdominal cramping or pain. Have difficulty breathing. Have chest pain. Have fainting spells. Have not felt your baby move for the time period told by your health care provider. Have new or increased pain, swelling, or redness in an arm or leg. Summary The second trimester of pregnancy is from week 13 through week 27 (months 4 through 6). Do not use herbal remedies, alcohol, illegal drugs, or medicines that are not approved by your health care provider. Chemicals in these products can harm your baby. Exercise only as directed by your health care provider. Most people can continue their usual exercise routine during pregnancy. Keep all follow-up visits. This is important. This information is not intended to replace advice given to you by your health care provider. Make sure you discuss any questions you have with your health care provider. Document Revised: 06/18/2019 Document Reviewed: 04/24/2019 Elsevier Patient Education  2024 [employer].  Birth Control Options Birth control is also called contraception. Birth control prevents pregnancy. There are many types of birth control. Work with your health care provider to find the best option for you. Birth control that uses hormones These types of birth  control have hormones in them to prevent pregnancy. Birth control implant This is a small tube that is put into the skin of your arm. The tube can stay in for up to 3 years. Birth control shot These are shots you get every 3 months. Birth control pills This is a pill you take every day. You need to take it at the same time each day. Birth control patch This is a patch that you put on your skin. You change it 1 time each week for 3 weeks. After that, you take the patch off for 1  week. Vaginal ring  This is a soft plastic ring that you put in your vagina. The ring is left in for 3 weeks. Then, you take it out for 1 week. Then, you put a new ring in. Barrier methods  Female condom This is a thin covering that you put on the penis before sex. The condom is thrown away after sex. Female condom This is a soft, loose covering that you put in the vagina before sex. The condom is thrown away after sex. Diaphragm A diaphragm is a soft barrier that is shaped like a bowl. It must be made to fit your body. You put it in the vagina before sex with a chemical that kills sperm called spermicide. A diaphragm should be left in the vagina for 6-8 hours after sex and taken out within 24 hours. You need to replace a diaphragm: Every 1-2 years. After giving birth. After gaining more than 15 lb (6.8 kg). If you have surgery on your pelvis. Cervical cap This is a small, soft cup that fits over the cervix. The cervix is the lowest part of the uterus. It's put in the vagina before sex, along with spermicide. The cap must be made for you. The cap should be left in for 6-8 hours after sex. It is taken out within 48 hours. A cervical cap must be prescribed and fit to your body by a provider. It should be replaced every 2 years. Sponge This is a small sponge that is put into the vagina before sex. It must be left in for at least 6 hours after sex. It must be taken out within 30 hours and thrown away. Spermicides These are chemicals that kill or stop sperm from getting into the uterus. They may be a pill, cream, jelly, or foam that you put into your vagina. They should be used at least 10-15 minutes before sex. Intrauterine device An intrauterine device (IUD) is a device that's put in the uterus by a provider. There are two types: Hormone IUD. This kind can stay in for 3-5 years. Copper IUD. This kind can stay in for 10 years. Permanent birth control Female tubal ligation This is surgery to  block the fallopian tubes. Hysteroscopic sterilization This is a procedure to put an insert into each fallopian tube. This method takes 3 months to work. Other forms of birth control must be used for 3 months. Female sterilization This is a surgery, called a vasectomy, to tie off the tubes that carry sperm in men. This method takes 3 months to work. Other forms of birth control must be used for 3 months. Natural planning methods This means not having sex on the days the female partner could get pregnant. Here are some types of natural planning birth control: Using a calendar: To keep track of the length of each menstrual cycle. To find out what days pregnancy can happen. To plan to  not have sex on days when pregnancy can happen. Watching for signs of ovulation and not having sex during this time. The female partner can check for ovulation by keeping track of their temperature each day. They can also look for changes in the mucus that comes from the cervix. Where to find more information Centers for Disease Control and Prevention: TonerPromos.no This information is not intended to replace advice given to you by your health care provider. Make sure you discuss any questions you have with your health care provider. Document Revised: 03/07/2022 Document Reviewed: 03/07/2022 Elsevier Patient Education  2024 [employer].

## 2022-10-05 NOTE — Progress Notes (Signed)
Subjective:   Tammie Diaz is a 27 y.o. G3P2002 at [redacted]w[redacted]d by early ultrasound being seen today for her first obstetrical visit.  Her obstetrical history is significant for  history of cesarean x2, hx of GDM . Patient does not intend to breast feed. Pregnancy history fully reviewed.  Patient reports no complaints.  HISTORY: OB History  Gravida Para Term Preterm AB Living  3 2 2  0 0 2  SAB IAB Ectopic Multiple Live Births  0 0 0 0 2    # Outcome Date GA Lbr Len/2nd Weight Sex Type Anes PTL Lv  3 Current           2 Term 04/30/19 [redacted]w[redacted]d  6 lb 5.6 oz (2.88 kg) M CS-Vac Spinal  LIV     Name: Staib,BOY Marrietta     Apgar1: 9  Apgar5: 10  1 Term 07/04/16 [redacted]w[redacted]d  7 lb 7 oz (3.374 kg) F CS-Unspec EPI N LIV     Name: daughter has autism     Last pap smear: Lab Results  Component Value Date   DIAGPAP  10/30/2018    - Negative for intraepithelial lesion or malignancy (NILM)   DIAGPAP Molecular only (A) 10/30/2018   Collected today  Past Medical History:  Diagnosis Date   Diet controlled gestational diabetes mellitus (GDM) in third trimester 03/19/2019   Past Surgical History:  Procedure Laterality Date   CESAREAN SECTION N/A 04/30/2019   Procedure: CESAREAN SECTION;  Surgeon: Kathrynn Running, MD;  Location: [facility];  Service: Obstetrics;  Laterality: N/A;   Family History  Problem Relation Age of Onset   Hypertension Mother    Diabetes Maternal Grandmother    Social History   Tobacco Use   Smoking status: Never   Smokeless tobacco: Never  Vaping Use   Vaping status: Never Used  Substance Use Topics   Alcohol use: Not Currently   Drug use: Never   No Known Allergies Current Outpatient Medications on File Prior to Visit  Medication Sig Dispense Refill   Prenatal Vit-Fe Fumarate-FA (PRENATAL MULTIVITAMIN) TABS tablet Take 1 tablet by mouth daily at 12 noon.     No current facility-administered medications on file prior to visit.     Exam   Vitals:    10/05/22 1045  BP: 109/74  Pulse: 92  Weight: 170 lb 3.2 oz (77.2 kg)   Fetal Heart Rate (bpm): 147  Uterus:     Pelvic Exam: Perineum: no hemorrhoids, normal perineum   Vulva: normal external genitalia, no lesions   Vagina:  normal mucosa mild amount of white discharge but denies any symptoms   Cervix: no lesions and normal, pap smear done.   System: General: well-developed, well-nourished female in no acute distress   Skin: normal coloration and turgor, no rashes   Neurologic: oriented, normal, negative, normal mood   Extremities: normal strength, tone, and muscle mass, ROM of all joints is normal   HEENT PERRLA, extraocular movement intact and sclera clear, anicteric   Neck supple and no masses   Respiratory:  no respiratory distress      Assessment:   Pregnancy: [id number] Patient Active Problem List   Diagnosis Date Noted   Supervision of low-risk pregnancy 09/06/2022   History of C-section 09/06/2022   History of gestational diabetes 09/06/2022     Plan:  1. Encounter for supervision of low-risk pregnancy in second trimester BP and FHR normal Initial labs drawn. Continue prenatal vitamins. Genetic Screening discussed, NIPS: ordered.  Ultrasound discussed; fetal anatomic survey: ordered. Problem list reviewed and updated. The nature of [facility] with multiple MDs and other Advanced Practice Providers was explained to patient; also emphasized that residents, students are part of our team.  2. History of C-section X2, plans on RCS+BTL Will send message at 28 wks and sign consent  3. History of gestational diabetes A1c today   Routine obstetric precautions reviewed. Return in 4 weeks (on 11/02/2022) for [facility], ob visit.

## 2022-10-06 LAB — CBC/D/PLT+RPR+RH+ABO+RUBIGG...
Antibody Screen: NEGATIVE
Basophils Absolute: 0 10*3/uL (ref 0.0–0.2)
Basos: 0 %
EOS (ABSOLUTE): 0 10*3/uL (ref 0.0–0.4)
Eos: 0 %
HCV Ab: NONREACTIVE
HIV Screen 4th Generation wRfx: NONREACTIVE
Hematocrit: 41.4 % (ref 34.0–46.6)
Hemoglobin: 13.3 g/dL (ref 11.1–15.9)
Hepatitis B Surface Ag: NEGATIVE
Immature Grans (Abs): 0 10*3/uL (ref 0.0–0.1)
Immature Granulocytes: 0 %
Lymphocytes Absolute: 1.4 10*3/uL (ref 0.7–3.1)
Lymphs: 15 %
MCH: 29.4 pg (ref 26.6–33.0)
MCHC: 32.1 g/dL (ref 31.5–35.7)
MCV: 92 fL (ref 79–97)
Monocytes Absolute: 0.6 10*3/uL (ref 0.1–0.9)
Monocytes: 7 %
Neutrophils Absolute: 7 10*3/uL (ref 1.4–7.0)
Neutrophils: 78 %
Platelets: 232 10*3/uL (ref 150–450)
RBC: 4.52 x10E6/uL (ref 3.77–5.28)
RDW: 12.9 % (ref 11.7–15.4)
RPR Ser Ql: NONREACTIVE
Rh Factor: POSITIVE
Rubella Antibodies, IGG: 6.52 {index} (ref >0.99)
WBC: 9 10*3/uL (ref 3.4–10.8)

## 2022-10-06 LAB — HEMOGLOBIN A1C
Est. average glucose Bld gHb Est-mCnc: 97 mg/dL
Hgb A1c MFr Bld: 5 % (ref 4.8–5.6)

## 2022-10-06 LAB — HCV INTERPRETATION

## 2022-10-09 LAB — URINE CULTURE, OB REFLEX

## 2022-10-09 LAB — CULTURE, OB URINE

## 2022-10-10 DIAGNOSIS — O99891 Other specified diseases and conditions complicating pregnancy: Secondary | ICD-10-CM | POA: Insufficient documentation

## 2022-10-10 DIAGNOSIS — R8271 Bacteriuria: Secondary | ICD-10-CM | POA: Insufficient documentation

## 2022-10-10 MED ORDER — AMOXICILLIN-POT CLAVULANATE 875-125 MG PO TABS
1.0000 | ORAL_TABLET | Freq: Two times a day (BID) | ORAL | 0 refills | Status: AC
Start: 2022-10-10 — End: 2022-10-15

## 2022-10-10 NOTE — Addendum Note (Signed)
Addended by: Merian Capron on: 10/10/2022 09:00 AM   Modules accepted: Orders

## 2022-10-12 LAB — PANORAMA PRENATAL TEST FULL PANEL:PANORAMA TEST PLUS 5 ADDITIONAL MICRODELETIONS: FETAL FRACTION: 12.6

## 2022-10-18 DIAGNOSIS — O99210 Obesity complicating pregnancy, unspecified trimester: Secondary | ICD-10-CM | POA: Insufficient documentation

## 2022-10-24 ENCOUNTER — Other Ambulatory Visit: Payer: Self-pay | Admitting: *Deleted

## 2022-10-24 ENCOUNTER — Ambulatory Visit: Payer: Medicaid Other | Attending: Obstetrics and Gynecology

## 2022-10-24 ENCOUNTER — Ambulatory Visit: Payer: Medicaid Other | Admitting: *Deleted

## 2022-10-24 VITALS — BP 112/77 | HR 84

## 2022-10-24 DIAGNOSIS — O09299 Supervision of pregnancy with other poor reproductive or obstetric history, unspecified trimester: Secondary | ICD-10-CM

## 2022-10-24 DIAGNOSIS — O99210 Obesity complicating pregnancy, unspecified trimester: Secondary | ICD-10-CM

## 2022-10-24 DIAGNOSIS — O34219 Maternal care for unspecified type scar from previous cesarean delivery: Secondary | ICD-10-CM | POA: Insufficient documentation

## 2022-10-24 DIAGNOSIS — Z3491 Encounter for supervision of normal pregnancy, unspecified, first trimester: Secondary | ICD-10-CM | POA: Diagnosis not present

## 2022-10-24 DIAGNOSIS — O99212 Obesity complicating pregnancy, second trimester: Secondary | ICD-10-CM

## 2022-10-24 DIAGNOSIS — Z8632 Personal history of gestational diabetes: Secondary | ICD-10-CM

## 2022-10-24 DIAGNOSIS — Z98891 History of uterine scar from previous surgery: Secondary | ICD-10-CM | POA: Diagnosis not present

## 2022-10-24 DIAGNOSIS — O09292 Supervision of pregnancy with other poor reproductive or obstetric history, second trimester: Secondary | ICD-10-CM | POA: Insufficient documentation

## 2022-10-24 DIAGNOSIS — Z3A19 19 weeks gestation of pregnancy: Secondary | ICD-10-CM | POA: Diagnosis not present

## 2022-10-24 DIAGNOSIS — Z363 Encounter for antenatal screening for malformations: Secondary | ICD-10-CM | POA: Insufficient documentation

## 2022-10-24 DIAGNOSIS — Z3492 Encounter for supervision of normal pregnancy, unspecified, second trimester: Secondary | ICD-10-CM

## 2022-11-03 ENCOUNTER — Encounter: Payer: Medicaid Other | Admitting: Obstetrics and Gynecology

## 2022-11-14 ENCOUNTER — Other Ambulatory Visit: Payer: Self-pay

## 2022-11-14 ENCOUNTER — Ambulatory Visit (INDEPENDENT_AMBULATORY_CARE_PROVIDER_SITE_OTHER): Payer: Medicaid Other | Admitting: Obstetrics and Gynecology

## 2022-11-14 VITALS — BP 109/75 | HR 81 | Wt 176.0 lb

## 2022-11-14 DIAGNOSIS — Z98891 History of uterine scar from previous surgery: Secondary | ICD-10-CM

## 2022-11-14 DIAGNOSIS — Z23 Encounter for immunization: Secondary | ICD-10-CM

## 2022-11-14 DIAGNOSIS — Z3A22 22 weeks gestation of pregnancy: Secondary | ICD-10-CM

## 2022-11-14 DIAGNOSIS — R8271 Bacteriuria: Secondary | ICD-10-CM

## 2022-11-14 DIAGNOSIS — O99891 Other specified diseases and conditions complicating pregnancy: Secondary | ICD-10-CM

## 2022-11-14 DIAGNOSIS — Z8632 Personal history of gestational diabetes: Secondary | ICD-10-CM

## 2022-11-14 DIAGNOSIS — Z3492 Encounter for supervision of normal pregnancy, unspecified, second trimester: Secondary | ICD-10-CM

## 2022-11-14 MED ORDER — CEFADROXIL 500 MG PO CAPS: 500.0000 mg | ORAL_CAPSULE | Freq: Two times a day (BID) | ORAL | 0 refills | Status: DC

## 2022-11-14 NOTE — Progress Notes (Unsigned)
   PRENATAL VISIT NOTE  Subjective:  Tammie Diaz is a 27 y.o. G3P2002 at [redacted]w[redacted]d being seen today for ongoing prenatal care.  She is currently monitored for the following issues for this low-risk pregnancy and has Supervision of low-risk pregnancy; History of C-section; History of gestational diabetes; Asymptomatic bacteriuria during pregnancy; GBS bacteriuria; and Obesity affecting pregnancy, antepartum on their problem list.  Patient unable to take the augmentin, maybe had one full dose .  Contractions: Not present. Vag. Bleeding: None.  Movement: Present. Denies leaking of fluid.   The following portions of the patient's history were reviewed and updated as appropriate: allergies, current medications, past family history, past medical history, past social history, past surgical history and problem list.   Objective:   Vitals:   11/14/22 1210  BP: 109/75  Pulse: 81  Weight: 176 lb (79.8 kg)    Fetal Status:     Movement: Present     General:  Alert, oriented and cooperative. Patient is in no acute distress.  Skin: Skin is warm and dry. No rash noted.   Cardiovascular: Normal heart rate noted  Respiratory: Normal respiratory effort, no problems with respiration noted  Abdomen: Soft, gravid, appropriate for gestational age.  Pain/Pressure: Present (at times, R ABD)     Pelvic: Cervical exam deferred        Extremities: Normal range of motion.  Edema: None  Mental Status: Normal mood and affect. Normal behavior. Normal judgment and thought content.   Assessment and Plan:  Pregnancy: G3P2002 at [redacted]w[redacted]d 1. Encounter for supervision of low-risk pregnancy in second trimester BP and FHR normal Feeling regular fetal movement   2. History of gestational diabetes Normal A1c, gtt next visit   3. History of C-section RCS x2, planning RCS + tubal, consent @ 28 wks Follow up growth in 4 weeks   4. Flu vaccine need  - Flu vaccine trivalent PF, 6mos and  older(Flulaval,Afluria,Fluarix,Fluzone)  5. [redacted] weeks gestation of pregnancy Anticipatory guidance regarding upcoming appts GTT and labs next visit  6. Asymptomatic bacteriuria during pregnancy New rx sent, TOC next visit - cefadroxil (DURICEF) 500 MG capsule; Take 1 capsule (500 mg total) by mouth 2 (two) times daily.  Dispense: 14 capsule; Refill: 0    Preterm labor symptoms and general obstetric precautions including but not limited to vaginal bleeding, contractions, leaking of fluid and fetal movement were reviewed in detail with the patient. Please refer to After Visit Summary for other counseling recommendations.   Return in about 4 weeks (around 12/12/2022), or High risk OB, for 2 hr GTT.  Future Appointments  Date Time Provider Department Center  11/30/2022 10:30 AM [facility] US5 [facility]  12/12/2022  8:20 AM [facility] LAB [facility]  12/12/2022  8:55 AM Crissie Reese, Mary Sella, MD [facility]     Albertine Grates, FNP

## 2022-11-14 NOTE — Patient Instructions (Signed)

## 2022-11-30 ENCOUNTER — Ambulatory Visit: Payer: Medicaid Other | Attending: Maternal & Fetal Medicine

## 2022-11-30 ENCOUNTER — Other Ambulatory Visit: Payer: Self-pay

## 2022-11-30 ENCOUNTER — Other Ambulatory Visit: Payer: Self-pay | Admitting: *Deleted

## 2022-11-30 DIAGNOSIS — Z3A24 24 weeks gestation of pregnancy: Secondary | ICD-10-CM

## 2022-11-30 DIAGNOSIS — O34219 Maternal care for unspecified type scar from previous cesarean delivery: Secondary | ICD-10-CM | POA: Diagnosis not present

## 2022-11-30 DIAGNOSIS — O09299 Supervision of pregnancy with other poor reproductive or obstetric history, unspecified trimester: Secondary | ICD-10-CM | POA: Insufficient documentation

## 2022-11-30 DIAGNOSIS — O99212 Obesity complicating pregnancy, second trimester: Secondary | ICD-10-CM | POA: Diagnosis not present

## 2022-11-30 DIAGNOSIS — E669 Obesity, unspecified: Secondary | ICD-10-CM | POA: Diagnosis not present

## 2022-11-30 DIAGNOSIS — Z8632 Personal history of gestational diabetes: Secondary | ICD-10-CM | POA: Insufficient documentation

## 2022-11-30 DIAGNOSIS — O09292 Supervision of pregnancy with other poor reproductive or obstetric history, second trimester: Secondary | ICD-10-CM | POA: Diagnosis not present

## 2022-12-05 ENCOUNTER — Other Ambulatory Visit: Payer: Self-pay

## 2022-12-05 DIAGNOSIS — Z3492 Encounter for supervision of normal pregnancy, unspecified, second trimester: Secondary | ICD-10-CM

## 2022-12-12 ENCOUNTER — Ambulatory Visit (HOSPITAL_COMMUNITY)
Admission: RE | Admit: 2022-12-12 | Discharge: 2022-12-12 | Disposition: A | Discharge status: Home / Self Care | Payer: Medicaid Other | Source: Ambulatory Visit | Attending: Family Medicine | Admitting: Family Medicine

## 2022-12-12 ENCOUNTER — Other Ambulatory Visit: Payer: Medicaid Other

## 2022-12-12 ENCOUNTER — Ambulatory Visit (INDEPENDENT_AMBULATORY_CARE_PROVIDER_SITE_OTHER): Payer: Medicaid Other | Admitting: Family Medicine

## 2022-12-12 ENCOUNTER — Encounter: Payer: Self-pay | Admitting: Family Medicine

## 2022-12-12 ENCOUNTER — Other Ambulatory Visit: Payer: Self-pay

## 2022-12-12 ENCOUNTER — Telehealth: Payer: Self-pay

## 2022-12-12 VITALS — BP 109/71 | HR 89 | Wt 181.5 lb

## 2022-12-12 DIAGNOSIS — Z3A26 26 weeks gestation of pregnancy: Secondary | ICD-10-CM

## 2022-12-12 DIAGNOSIS — Z86718 Personal history of other venous thrombosis and embolism: Secondary | ICD-10-CM | POA: Insufficient documentation

## 2022-12-12 DIAGNOSIS — Z23 Encounter for immunization: Secondary | ICD-10-CM | POA: Diagnosis not present

## 2022-12-12 DIAGNOSIS — Z3492 Encounter for supervision of normal pregnancy, unspecified, second trimester: Secondary | ICD-10-CM

## 2022-12-12 DIAGNOSIS — R8271 Bacteriuria: Secondary | ICD-10-CM

## 2022-12-12 DIAGNOSIS — O99212 Obesity complicating pregnancy, second trimester: Secondary | ICD-10-CM

## 2022-12-12 DIAGNOSIS — M79606 Pain in leg, unspecified: Secondary | ICD-10-CM

## 2022-12-12 DIAGNOSIS — O99891 Other specified diseases and conditions complicating pregnancy: Secondary | ICD-10-CM

## 2022-12-12 DIAGNOSIS — O99210 Obesity complicating pregnancy, unspecified trimester: Secondary | ICD-10-CM

## 2022-12-12 DIAGNOSIS — Z98891 History of uterine scar from previous surgery: Secondary | ICD-10-CM

## 2022-12-12 LAB — OB RESULTS CONSOLE GBS: GBS: POSITIVE

## 2022-12-12 MED ORDER — CYCLOBENZAPRINE HCL 10 MG PO TABS: 10.0000 mg | ORAL_TABLET | Freq: Three times a day (TID) | ORAL | 1 refills | Status: DC | PRN

## 2022-12-12 NOTE — Telephone Encounter (Signed)
Patient returned call to office--verified patient with full name and DOB. Relayed results to patient, patient verbalized understanding but had asked what she could do for the pain. Consulted with resulting provider, Crissie Reese MD, in office--informed patient that, per provider, patient could try leg massages and/or we could send in Rx for flexeril. Patient states she would try massages but also wants [place] to send in Rx to [facility] off of [address]. Rx sent per protocol (10mg  q. 8 hours PRN--verified with provider in office). Patient verbalized understanding and had no other questions or concerns.   Maureen Ralphs RN on 12/12/22 at [phone]

## 2022-12-12 NOTE — Addendum Note (Signed)
Addended by: Brien Mates T on: 12/12/2022 09:50 AM   Modules accepted: Orders

## 2022-12-12 NOTE — Telephone Encounter (Signed)
Provider in office Crissie Reese MD), asked RN to call patient via secure chat message to inform her of LE duplex results. Per provider, "LE duplex normal-no dvt-everything looks normal and good".   Attempted to call patient with number listed in chart--sent to unidentified VM--left message for patient to call office back to discuss results.   Maureen Ralphs RN on 12/12/22 at [phone]
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
## 2022-12-12 NOTE — Patient Instructions (Signed)

## 2022-12-13 LAB — GLUCOSE TOLERANCE, 2 HOURS W/ 1HR
Glucose, 1 hour: 127 mg/dL (ref 70–179)
Glucose, 2 hour: 112 mg/dL (ref 70–152)
Glucose, Fasting: 81 mg/dL (ref 70–91)

## 2022-12-13 LAB — HIV ANTIBODY (ROUTINE TESTING W REFLEX): HIV Screen 4th Generation wRfx: NONREACTIVE

## 2022-12-13 LAB — CBC
Hematocrit: 35.1 % (ref 34.0–46.6)
Hemoglobin: 11.5 g/dL (ref 11.1–15.9)
MCH: 29 pg (ref 26.6–33.0)
MCHC: 32.8 g/dL (ref 31.5–35.7)
MCV: 88 fL (ref 79–97)
Platelets: 253 10*3/uL (ref 150–450)
RBC: 3.97 x10E6/uL (ref 3.77–5.28)
RDW: 12.6 % (ref 11.7–15.4)
WBC: 9.1 10*3/uL (ref 3.4–10.8)

## 2022-12-13 LAB — RPR: RPR Ser Ql: NONREACTIVE

## 2022-12-17 LAB — CULTURE, OB URINE

## 2022-12-17 LAB — URINE CULTURE, OB REFLEX

## 2022-12-19 ENCOUNTER — Encounter: Payer: Self-pay | Admitting: *Deleted

## 2023-01-03 ENCOUNTER — Other Ambulatory Visit: Payer: Self-pay

## 2023-01-03 ENCOUNTER — Ambulatory Visit: Payer: Medicaid Other | Attending: Maternal & Fetal Medicine

## 2023-01-03 ENCOUNTER — Ambulatory Visit: Payer: Medicaid Other | Admitting: Family Medicine

## 2023-01-03 VITALS — BP 104/72 | HR 91 | Wt 184.0 lb

## 2023-01-03 DIAGNOSIS — R8271 Bacteriuria: Secondary | ICD-10-CM

## 2023-01-03 DIAGNOSIS — Z8632 Personal history of gestational diabetes: Secondary | ICD-10-CM

## 2023-01-03 DIAGNOSIS — O99210 Obesity complicating pregnancy, unspecified trimester: Secondary | ICD-10-CM

## 2023-01-03 DIAGNOSIS — O99212 Obesity complicating pregnancy, second trimester: Secondary | ICD-10-CM | POA: Insufficient documentation

## 2023-01-03 DIAGNOSIS — E669 Obesity, unspecified: Secondary | ICD-10-CM

## 2023-01-03 DIAGNOSIS — Z3493 Encounter for supervision of normal pregnancy, unspecified, third trimester: Secondary | ICD-10-CM

## 2023-01-03 DIAGNOSIS — O99810 Abnormal glucose complicating pregnancy: Secondary | ICD-10-CM

## 2023-01-03 DIAGNOSIS — Z98891 History of uterine scar from previous surgery: Secondary | ICD-10-CM

## 2023-01-03 DIAGNOSIS — O99213 Obesity complicating pregnancy, third trimester: Secondary | ICD-10-CM

## 2023-01-03 DIAGNOSIS — O99891 Other specified diseases and conditions complicating pregnancy: Secondary | ICD-10-CM

## 2023-01-03 DIAGNOSIS — Z3A29 29 weeks gestation of pregnancy: Secondary | ICD-10-CM

## 2023-01-03 DIAGNOSIS — O09293 Supervision of pregnancy with other poor reproductive or obstetric history, third trimester: Secondary | ICD-10-CM

## 2023-01-03 DIAGNOSIS — O34219 Maternal care for unspecified type scar from previous cesarean delivery: Secondary | ICD-10-CM

## 2023-01-03 DIAGNOSIS — O09299 Supervision of pregnancy with other poor reproductive or obstetric history, unspecified trimester: Secondary | ICD-10-CM | POA: Insufficient documentation

## 2023-01-03 DIAGNOSIS — O98313 Other infections with a predominantly sexual mode of transmission complicating pregnancy, third trimester: Secondary | ICD-10-CM

## 2023-01-03 NOTE — Progress Notes (Signed)
   PRENATAL VISIT NOTE  Subjective:  Tammie Diaz is a 27 y.o. G3P2002 at [redacted]w[redacted]d being seen today for ongoing prenatal care.  She is currently monitored for the following issues for this low-risk pregnancy and has Supervision of low-risk pregnancy; History of C-section; History of gestational diabetes; Asymptomatic bacteriuria during pregnancy; GBS bacteriuria; and Obesity affecting pregnancy, antepartum on their problem list.  Patient reports no complaints.  Contractions: Not present. Vag. Bleeding: None.  Movement: Present. Denies leaking of fluid.   The following portions of the patient's history were reviewed and updated as appropriate: allergies, current medications, past family history, past medical history, past social history, past surgical history and problem list.   Objective:   Vitals:   01/03/23 1629  BP: 104/72  Pulse: 91  Weight: 184 lb (83.5 kg)    Fetal Status: Fetal Heart Rate (bpm): 154   Movement: Present     General:  Alert, oriented and cooperative. Patient is in no acute distress.  Skin: Skin is warm and dry. No rash noted.   Cardiovascular: Normal heart rate noted  Respiratory: Normal respiratory effort, no problems with respiration noted  Abdomen: Soft, gravid, appropriate for gestational age.  Pain/Pressure: Absent     Pelvic: Cervical exam deferred        Extremities: Normal range of motion.  Edema: None  Mental Status: Normal mood and affect. Normal behavior. Normal judgment and thought content.   Assessment and Plan:  Pregnancy: G3P2002 at [redacted]w[redacted]d 1. Encounter for supervision of low-risk pregnancy in third trimester Up to date FH appropriate Vigorous movement No concerns today  2. Obesity affecting pregnancy, antepartum, unspecified obesity type TWG=13 lb (5.897 kg)   3. History of gestational diabetes No GDM this pregnancy  4. History of C-section Repeat on 2/15 per patient discussion with eck[facility]  5. GBS bacteriuria Pcn in labor  6.  Asymptomatic bacteriuria during pregnancy TOC neg  Preterm labor symptoms and general obstetric precautions including but not limited to vaginal bleeding, contractions, leaking of fluid and fetal movement were reviewed in detail with the patient. Please refer to After Visit Summary for other counseling recommendations.   Return in about 2 weeks (around 01/17/2023) for Routine prenatal care, MD or APP.  No future appointments.   Federico Flake, MD

## 2023-01-19 ENCOUNTER — Other Ambulatory Visit: Payer: Self-pay

## 2023-01-19 ENCOUNTER — Ambulatory Visit (INDEPENDENT_AMBULATORY_CARE_PROVIDER_SITE_OTHER): Payer: Medicaid Other | Admitting: Obstetrics and Gynecology

## 2023-01-19 ENCOUNTER — Other Ambulatory Visit (HOSPITAL_COMMUNITY)
Admission: RE | Admit: 2023-01-19 | Discharge: 2023-01-19 | Disposition: A | Discharge status: Home / Self Care | Payer: Medicaid Other | Source: Ambulatory Visit | Attending: Obstetrics and Gynecology | Admitting: Obstetrics and Gynecology

## 2023-01-19 VITALS — BP 114/73 | HR 81 | Wt 185.3 lb

## 2023-01-19 DIAGNOSIS — O99213 Obesity complicating pregnancy, third trimester: Secondary | ICD-10-CM

## 2023-01-19 DIAGNOSIS — N898 Other specified noninflammatory disorders of vagina: Secondary | ICD-10-CM | POA: Diagnosis present

## 2023-01-19 DIAGNOSIS — Z8632 Personal history of gestational diabetes: Secondary | ICD-10-CM

## 2023-01-19 DIAGNOSIS — R8271 Bacteriuria: Secondary | ICD-10-CM

## 2023-01-19 DIAGNOSIS — O99210 Obesity complicating pregnancy, unspecified trimester: Secondary | ICD-10-CM

## 2023-01-19 DIAGNOSIS — Z98891 History of uterine scar from previous surgery: Secondary | ICD-10-CM

## 2023-01-19 DIAGNOSIS — Z3A31 31 weeks gestation of pregnancy: Secondary | ICD-10-CM

## 2023-01-19 DIAGNOSIS — Z3493 Encounter for supervision of normal pregnancy, unspecified, third trimester: Secondary | ICD-10-CM

## 2023-01-19 MED ORDER — FLUCONAZOLE 150 MG PO TABS: 150.0000 mg | ORAL_TABLET | Freq: Once | ORAL | 1 refills | Status: AC

## 2023-01-19 NOTE — Progress Notes (Signed)
   PRENATAL VISIT NOTE  Subjective:  Tammie Diaz is a 27 y.o. G3P2002 at [redacted]w[redacted]d being seen today for ongoing prenatal care.  She is currently monitored for the following issues for this low-risk pregnancy and has Supervision of low-risk pregnancy; History of C-section; History of gestational diabetes; Asymptomatic bacteriuria during pregnancy; GBS bacteriuria; and Obesity affecting pregnancy, antepartum on their problem list.  Patient reports  vaginal itching and discharge .  Contractions: Not present. Vag. Bleeding: None.  Movement: Present. Denies leaking of fluid.   The following portions of the patient's history were reviewed and updated as appropriate: allergies, current medications, past family history, past medical history, past social history, past surgical history and problem list.   Objective:   Vitals:   01/19/23 0931  BP: 114/73  Pulse: 81  Weight: 185 lb 4.8 oz (84.1 kg)    Fetal Status: Fetal Heart Rate (bpm): 136   Movement: Present     General:  Alert, oriented and cooperative. Patient is in no acute distress.  Skin: Skin is warm and dry. No rash noted.   Cardiovascular: Normal heart rate noted  Respiratory: Normal respiratory effort, no problems with respiration noted  Abdomen: Soft, gravid, appropriate for gestational age.  Pain/Pressure: Absent     Pelvic: Cervical exam deferred        Extremities: Normal range of motion.  Edema: None  Mental Status: Normal mood and affect. Normal behavior. Normal judgment and thought content.   Assessment and Plan:  Pregnancy: G3P2002 at [redacted]w[redacted]d 1. Encounter for supervision of low-risk pregnancy in third trimester (Primary) BP and FHR normal Doing well, feeling regular movement    2. Obesity affecting pregnancy, antepartum, unspecified obesity type 12/11 u/s EFW 22%,  afi normal  3. History of gestational diabetes Normal GTT this pregnancy  4. History of C-section Referral placed previously planning repeat for  2/15  5. GBS bacteriuria Tx in labor   6. [redacted] weeks gestation of pregnancy Up to date   7. Vaginal itching Swab collected today, will send symptomatic treatment    Preterm labor symptoms and general obstetric precautions including but not limited to vaginal bleeding, contractions, leaking of fluid and fetal movement were reviewed in detail with the patient. Please refer to After Visit Summary for other counseling recommendations.   Return in two weeks for routine prenatal   Future Appointments  Date Time Provider Department Center  02/02/2023 10:55 AM Sue Lush, FNP [facility]  02/19/2023  1:15 PM Warden Fillers, MD [facility]    Albertine Grates, FNP

## 2023-01-22 LAB — CERVICOVAGINAL ANCILLARY ONLY
Bacterial Vaginitis (gardnerella): POSITIVE — AB
Candida Glabrata: NEGATIVE
Candida Vaginitis: POSITIVE — AB
Comment: NEGATIVE
Comment: NEGATIVE
Comment: NEGATIVE

## 2023-01-22 MED ORDER — METRONIDAZOLE 500 MG PO TABS: 500.0000 mg | ORAL_TABLET | Freq: Two times a day (BID) | ORAL | 0 refills | Status: AC

## 2023-01-22 NOTE — Addendum Note (Signed)
Addended by: Sue Lush on: 01/22/2023 12:51 PM   Modules accepted: Orders

## 2023-01-23 ENCOUNTER — Encounter: Payer: Self-pay | Admitting: Obstetrics & Gynecology

## 2023-01-23 ENCOUNTER — Other Ambulatory Visit: Payer: Self-pay

## 2023-01-23 MED ORDER — METRONIDAZOLE 0.75 % VA GEL: 1.0000 | Freq: Two times a day (BID) | VAGINAL | 0 refills | Status: DC

## 2023-02-02 ENCOUNTER — Encounter: Payer: Medicaid Other | Admitting: Obstetrics and Gynecology

## 2023-02-05 ENCOUNTER — Ambulatory Visit (INDEPENDENT_AMBULATORY_CARE_PROVIDER_SITE_OTHER): Payer: Medicaid Other | Admitting: Obstetrics and Gynecology

## 2023-02-05 VITALS — BP 106/64 | HR 98 | Wt 189.3 lb

## 2023-02-05 DIAGNOSIS — R8271 Bacteriuria: Secondary | ICD-10-CM

## 2023-02-05 DIAGNOSIS — Z3493 Encounter for supervision of normal pregnancy, unspecified, third trimester: Secondary | ICD-10-CM

## 2023-02-05 DIAGNOSIS — Z98891 History of uterine scar from previous surgery: Secondary | ICD-10-CM

## 2023-02-05 DIAGNOSIS — Z8632 Personal history of gestational diabetes: Secondary | ICD-10-CM

## 2023-02-05 DIAGNOSIS — Z3A34 34 weeks gestation of pregnancy: Secondary | ICD-10-CM

## 2023-02-05 NOTE — Progress Notes (Signed)
   PRENATAL VISIT NOTE  Subjective:  Tammie Diaz is a 28 y.o. G3P2002 at [redacted]w[redacted]d being seen today for ongoing prenatal care.  She is currently monitored for the following issues for this low-risk pregnancy and has Supervision of low-risk pregnancy; History of 2 cesarean sections; History of gestational diabetes; Asymptomatic bacteriuria during pregnancy; GBS bacteriuria; and Obesity affecting pregnancy, antepartum on their problem list.  Patient reports no complaints.  Contractions: Irritability. Vag. Bleeding: None.  Movement: Present. Denies leaking of fluid.   The following portions of the patient's history were reviewed and updated as appropriate: allergies, current medications, past family history, past medical history, past social history, past surgical history and problem list.   Objective:   Vitals:   02/05/23 1323  BP: 106/64  Pulse: 98  Weight: 189 lb 4.8 oz (85.9 kg)    Fetal Status: Fetal Heart Rate (bpm): 140   Movement: Present     General:  Alert, oriented and cooperative. Patient is in no acute distress.  Skin: Skin is warm and dry. No rash noted.   Cardiovascular: Normal heart rate noted  Respiratory: Normal respiratory effort, no problems with respiration noted  Abdomen: Soft, gravid, appropriate for gestational age.  Pain/Pressure: Present     Pelvic: Cervical exam deferred        Extremities: Normal range of motion.  Edema: Trace  Mental Status: Normal mood and affect. Normal behavior. Normal judgment and thought content.   Assessment and Plan:  Pregnancy: G3P2002 at 103w2d 1. Encounter for supervision of low-risk pregnancy in third trimester (Primary) BP and FHR normal Doing well, feeling regular movement    2. History of gestational diabetes Normal GTT  3. History of C-section RCS scheduled for 2/15  4. GBS bacteriuria Tx in labor   5. [redacted] weeks gestation of pregnancy Anticipatory guidance regarding swabs  next visit  Preterm labor symptoms and  general obstetric precautions including but not limited to vaginal bleeding, contractions, leaking of fluid and fetal movement were reviewed in detail with the patient. Please refer to After Visit Summary for other counseling recommendations.   Return in two weeks for routine prenatal   Future Appointments  Date Time Provider Department Center  02/19/2023  1:15 PM Zina Jerilynn LABOR, MD [facility]    Nidia Daring, FNP

## 2023-02-05 NOTE — Patient Instructions (Signed)
 RSV Vaccination for Pregnant People  CDC recommends two ways to protect babies from getting very sick with Respiratory Syncytial Virus (RSV):  An RSV vaccination given during pregnancy  Pfizer's vaccine Verdis Frederickson) is recommended for use during pregnancy. It is given during RSV season to people who are 32 through [redacted] weeks pregnant.  Or, An RSV immunization given directly to infants and some older babies  Babies born to mothers who get RSV vaccine at least 2 weeks before delivery will have protection and, in most cases, should not need an RSV immunization later.    When is RSV season?  In most regions of the [place] RSV season starts in the fall and peaks in the winter, but the timing and severity of RSV season can vary from place to place and year to year.   The goal of maternal RSV vaccination is to protect babies from getting very sick with RSV during their first RSV season.  In most of the [place], this means maternal RSV vaccine will be given in September through January.  Who should get the maternal RSV vaccine?  People who are 85 through [redacted] weeks pregnant during September through January should get one dose of maternal RSV vaccine to protect their babies. RSV season can vary around the country.   How is the maternal RSV vaccine administered?  Maternal RSV vaccine is given as a shot into the mother's upper arm. Only a single dose (one shot) of maternal RSV vaccine is recommended.   It is not yet known whether another dose might be needed in later pregnancies.  How well does the maternal RSV vaccine work?  When someone gets RSV vaccine, their body responds by making a protein that protects against the virus that causes RSV. The process takes about 2 weeks. When a pregnant person gets RSV vaccine, their protective proteins (called antibodies) also pass to their baby. So, babies who are born at least 2 weeks after their mother gets RSV vaccine are protected  at birth, when infants are at the highest risk of severe RSV disease.   The vaccine can reduce a baby's risk of being hospitalized from RSV by 57% in the first six months after birth.  What are the possible side effects of the maternal RSV vaccine?  In the clinical trials, the side effects most often reported by pregnant people who received the maternal RSV vaccine were pain at the injection site, headache, muscle pain, and nausea.  Although not common, a dangerous high blood pressure condition called pre-eclampsia occurred in 1.8% of pregnant people who received the maternal RSV vaccine compared to 1.4% of pregnant people who received a placebo.  The clinical trials identified a small increase in the number of preterm births in vaccinated pregnant people. It is not clear if this is a true safety problem related to RSV vaccine or if this occurred for reasons unrelated to vaccination.  To reduce the potential risk of preterm birth and complications from RSV disease, FDA approved the maternal RSV vaccine for use during weeks 32 through 84 of pregnancy while additional studies are conducted.  FDA is requiring the manufacturer to do additional studies that will look more closely at the potential risk of preterm births and pregnancy-related high blood pressure issues in mothers, including pre-eclampsia.  Severe allergic reactions to vaccines are rare but can happen after any vaccine and can be life-threatening. If you see signs of a severe allergic reaction after vaccination (hives, swelling of the face  and throat, difficulty breathing, a fast heartbeat, dizziness, or weakness), seek immediate medical care by calling 911.  As with any medicine or vaccine there is a very remote chance of the vaccine causing other serious injury or death after vaccination.  Adverse events following vaccination should be reported to the Vaccine Adverse Event Reporting System (VAERS), even if it's not clear that the vaccine  caused the adverse event. You or your doctor can report an adverse event to [facility] and FDA through VAERS. If you need further assistance reporting to VAERS, please email [email or web address] [email or web address] or call 5067167565.  If you have any questions about side effects from the maternal RSV vaccine, talk with your healthcare provider.  Do I need a prescription for a maternal RSV vaccine?  Until the vaccine available in the office, you will need a prescription to take to a local pharmacy that is providing the vaccine.   How do I pay for the maternal RSV vaccine?  Most private health insurance plans cover the maternal RSV vaccine, but there may be a cost to you depending on your plan.  Contact your insurer to find out.  Medicaid Beginning October 23, 2021, most people with coverage from [facility] and [employer] [facility]) will be guaranteed coverage of all vaccines recommended by the Advisory Committee on Immunization Practice at no cost to them.   Source: [facility] for Immunization and Respiratory Diseases

## 2023-02-19 ENCOUNTER — Other Ambulatory Visit (HOSPITAL_COMMUNITY)
Admission: RE | Admit: 2023-02-19 | Discharge: 2023-02-19 | Disposition: A | Discharge status: Home / Self Care | Payer: Medicaid Other | Source: Ambulatory Visit | Attending: Family Medicine | Admitting: Family Medicine

## 2023-02-19 ENCOUNTER — Encounter: Payer: Self-pay | Admitting: Obstetrics and Gynecology

## 2023-02-19 ENCOUNTER — Ambulatory Visit: Payer: Medicaid Other | Admitting: Family Medicine

## 2023-02-19 ENCOUNTER — Other Ambulatory Visit: Payer: Self-pay

## 2023-02-19 VITALS — BP 123/79 | HR 82 | Wt 193.7 lb

## 2023-02-19 DIAGNOSIS — O368130 Decreased fetal movements, third trimester, not applicable or unspecified: Secondary | ICD-10-CM | POA: Diagnosis not present

## 2023-02-19 DIAGNOSIS — Z3A36 36 weeks gestation of pregnancy: Secondary | ICD-10-CM | POA: Diagnosis not present

## 2023-02-19 DIAGNOSIS — Z3493 Encounter for supervision of normal pregnancy, unspecified, third trimester: Secondary | ICD-10-CM | POA: Diagnosis present

## 2023-02-19 DIAGNOSIS — R8271 Bacteriuria: Secondary | ICD-10-CM | POA: Diagnosis not present

## 2023-02-19 DIAGNOSIS — Z98891 History of uterine scar from previous surgery: Secondary | ICD-10-CM | POA: Diagnosis not present

## 2023-02-19 DIAGNOSIS — Z8632 Personal history of gestational diabetes: Secondary | ICD-10-CM

## 2023-02-19 NOTE — Progress Notes (Unsigned)
   PRENATAL VISIT NOTE  Subjective:  Tammie Diaz is a 28 y.o. G3P2002 at [redacted]w[redacted]d being seen today for ongoing prenatal care.  She is currently monitored for the following issues for this low-risk pregnancy and has Supervision of low-risk pregnancy; History of 2 cesarean sections; History of gestational diabetes; Asymptomatic bacteriuria during pregnancy; GBS bacteriuria; and Obesity affecting pregnancy, antepartum on their problem list.  Patient reports no complaints.  Contractions: Irritability. Vag. Bleeding: None.  Movement: Present. Denies leaking of fluid. Reports decreased FM.  The following portions of the patient's history were reviewed and updated as appropriate: allergies, current medications, past family history, past medical history, past social history, past surgical history and problem list.   Objective:   Vitals:   02/19/23 1115  BP: 123/79  Pulse: 82  Weight: 193 lb 11.2 oz (87.9 kg)    Fetal Status: Fetal Heart Rate (bpm): 134 Fundal Height: 32 cm Movement: Present     General:  Alert, oriented and cooperative. Patient is in no acute distress.  Skin: Skin is warm and dry. No rash noted.   Cardiovascular: Normal heart rate noted  Respiratory: Normal respiratory effort, no problems with respiration noted  Abdomen: Soft, gravid, appropriate for gestational age.  Pain/Pressure: Present     Pelvic: Cervical exam deferred Dilation: Closed Effacement (%): 80, 90 Station: -1, -2  Extremities: Normal range of motion.  Edema: Mild pitting, slight indentation  Mental Status: Normal mood and affect. Normal behavior. Normal judgment and thought content.  NST:  Baseline: 140 bpm, Variability: Good {> 6 bpm), Accelerations: Reactive, and Decelerations: Absent  Assessment and Plan:  Pregnancy: G3P2002 at [redacted]w[redacted]d 1. History of 2 cesarean sections (Primary) For RCS Labor precautions  2. Encounter for supervision of low-risk pregnancy in third trimester GBS positive in  urine GC/Chlam today - Cervicovaginal ancillary only( [place])  3. History of gestational diabetes Normal testing this time  4. GBS bacteriuria Would need treatment if labors  5. [redacted] weeks gestation of pregnancy   Preterm labor symptoms and general obstetric precautions including but not limited to vaginal bleeding, contractions, leaking of fluid and fetal movement were reviewed in detail with the patient. Please refer to After Visit Summary for other counseling recommendations.   Return in 1 week (on 02/26/2023).  Future Appointments  Date Time Provider Department Center  02/26/2023  2:15 PM Lorriane Shire, MD [facility]    Reva Bores, MD

## 2023-02-20 LAB — CERVICOVAGINAL ANCILLARY ONLY
Chlamydia: NEGATIVE
Comment: NEGATIVE
Comment: NORMAL
Neisseria Gonorrhea: NEGATIVE

## 2023-02-26 ENCOUNTER — Encounter: Payer: Medicaid Other | Admitting: Obstetrics and Gynecology

## 2023-02-27 ENCOUNTER — Other Ambulatory Visit: Payer: Self-pay

## 2023-02-27 ENCOUNTER — Ambulatory Visit: Payer: Medicaid Other | Admitting: Obstetrics and Gynecology

## 2023-02-27 VITALS — BP 116/80 | HR 87 | Wt 194.0 lb

## 2023-02-27 DIAGNOSIS — R8271 Bacteriuria: Secondary | ICD-10-CM

## 2023-02-27 DIAGNOSIS — Z3A37 37 weeks gestation of pregnancy: Secondary | ICD-10-CM | POA: Diagnosis not present

## 2023-02-27 DIAGNOSIS — Z98891 History of uterine scar from previous surgery: Secondary | ICD-10-CM

## 2023-02-27 DIAGNOSIS — Z3493 Encounter for supervision of normal pregnancy, unspecified, third trimester: Secondary | ICD-10-CM

## 2023-02-27 NOTE — Progress Notes (Signed)
   PRENATAL VISIT NOTE  Subjective:  Tammie Diaz is a 28 y.o. G3P2002 at [redacted]w[redacted]d being seen today for ongoing prenatal care.  She is currently monitored for the following issues for this low-risk pregnancy and has Supervision of low-risk pregnancy; History of 2 cesarean sections; History of gestational diabetes; Asymptomatic bacteriuria during pregnancy; GBS bacteriuria; and Obesity affecting pregnancy, antepartum on their problem list.  Patient reports no complaints.  Contractions: Irritability. Vag. Bleeding: None.  Movement: Present. Denies leaking of fluid.   The following portions of the patient's history were reviewed and updated as appropriate: allergies, current medications, past family history, past medical history, past social history, past surgical history and problem list.   Objective:   Vitals:   02/27/23 1132  BP: 116/80  Pulse: 87  Weight: 194 lb (88 kg)    Fetal Status: Fetal Heart Rate (bpm): 141   Movement: Present     General:  Alert, oriented and cooperative. Patient is in no acute distress.  Skin: Skin is warm and dry. No rash noted.   Cardiovascular: Normal heart rate noted  Respiratory: Normal respiratory effort, no problems with respiration noted  Abdomen: Soft, gravid, appropriate for gestational age.  Pain/Pressure: Absent     Pelvic: Cervical exam deferred        Extremities: Normal range of motion.  Edema: Trace  Mental Status: Normal mood and affect. Normal behavior. Normal judgment and thought content.   Assessment and Plan:  Pregnancy: G3P2002 at [redacted]w[redacted]d 1. Encounter for supervision of low-risk pregnancy in third trimester (Primary) BP and FHR normal Doing well, feeling regular movement  FH appropriate  2. History of 2 cesarean sections RCS scheduled 2/15 Discussed preop labs at the hospital scheduled for 2/13  3. GBS bacteriuria Tx in labor  4. [redacted] weeks gestation of pregnancy Asked about RSV vaccine, out of window discussed option of infant  receiving it at hospital   Term labor symptoms and general obstetric precautions including but not limited to vaginal bleeding, contractions, leaking of fluid and fetal movement were reviewed in detail with the patient. Please refer to After Visit Summary for other counseling recommendations.   Return in about 1 week (around 03/06/2023) for OB VISIT (MD or APP).  Future Appointments  Date Time Provider Department Center  03/08/2023  9:30 AM [facility] 1 [facility] None      Nidia Daring, FNP

## 2023-03-02 ENCOUNTER — Encounter (HOSPITAL_COMMUNITY): Payer: Self-pay

## 2023-03-07 ENCOUNTER — Encounter: Payer: Medicaid Other | Admitting: Family Medicine

## 2023-03-08 ENCOUNTER — Encounter (HOSPITAL_COMMUNITY)
Admission: RE | Admit: 2023-03-08 | Discharge: 2023-03-08 | Disposition: A | Discharge status: Home / Self Care | Payer: Medicaid Other | Source: Ambulatory Visit | Attending: Obstetrics & Gynecology | Admitting: Obstetrics & Gynecology

## 2023-03-08 DIAGNOSIS — Z98891 History of uterine scar from previous surgery: Secondary | ICD-10-CM

## 2023-03-08 DIAGNOSIS — Z8632 Personal history of gestational diabetes: Secondary | ICD-10-CM | POA: Diagnosis not present

## 2023-03-08 DIAGNOSIS — Z01812 Encounter for preprocedural laboratory examination: Secondary | ICD-10-CM | POA: Insufficient documentation

## 2023-03-08 DIAGNOSIS — Z01818 Encounter for other preprocedural examination: Secondary | ICD-10-CM | POA: Diagnosis present

## 2023-03-08 LAB — BASIC METABOLIC PANEL
Anion gap: 8 (ref 5–15)
BUN: 6 mg/dL (ref 6–20)
CO2: 16 mmol/L — ABNORMAL LOW (ref 22–32)
Calcium: 8.6 mg/dL — ABNORMAL LOW (ref 8.9–10.3)
Chloride: 109 mmol/L (ref 98–111)
Creatinine, Ser: 0.62 mg/dL (ref 0.44–1.00)
GFR, Estimated: >60 mL/min (ref >60)
Glucose, Bld: 77 mg/dL (ref 70–99)
Potassium: 3.4 mmol/L — ABNORMAL LOW (ref 3.5–5.1)
Sodium: 133 mmol/L — ABNORMAL LOW (ref 135–145)

## 2023-03-08 LAB — CBC
HCT: 33.4 % — ABNORMAL LOW (ref 36.0–46.0)
Hemoglobin: 10.7 g/dL — ABNORMAL LOW (ref 12.0–15.0)
MCH: 26.2 pg (ref 26.0–34.0)
MCHC: 32 g/dL (ref 30.0–36.0)
MCV: 81.7 fL (ref 80.0–100.0)
Platelets: 267 10*3/uL (ref 150–400)
RBC: 4.09 MIL/uL (ref 3.87–5.11)
RDW: 14.3 % (ref 11.5–15.5)
WBC: 9.8 10*3/uL (ref 4.0–10.5)
nRBC: 0 % (ref 0.0–0.2)

## 2023-03-08 LAB — RPR: RPR Ser Ql: NONREACTIVE

## 2023-03-09 ENCOUNTER — Encounter (HOSPITAL_COMMUNITY): Payer: Self-pay | Admitting: Obstetrics & Gynecology

## 2023-03-09 NOTE — H&P (Signed)
Obstetric Preoperative History and Physical  Tammie Diaz is a 28 y.o. [id number] with IUP at [redacted]w[redacted]d presenting for scheduled repeat cesarean section.  Reports good fetal movement, no bleeding, no contractions, no leaking of fluid.  No acute preoperative concerns.    Cesarean Section Indication:  Previous cesarean section x 2  Prenatal Course Source of Care: [facility]  with onset of care at 12 weeks Pregnancy complications or risks: Patient Active Problem List   Diagnosis Date Noted   S/P cesarean section 03/10/2023   Obesity affecting pregnancy, antepartum 10/18/2022   GBS bacteriuria 10/10/2022   Supervision of low-risk pregnancy 09/06/2022   History of 2 cesarean sections 09/06/2022   NURSING  PROVIDER  Office Location [facility] Dating by U/S at 10 wks  [facility] Model Traditional Anatomy U/S Wnl, f/w MFM  Initiated care at  [employer]  English              LAB RESULTS   Support Person  Genetics NIPS: low risk AFP: not done     Carrier Screen Horizon: neg 14/14 10/30/2018  Rhogam  O/Positive/-- (09/12 1310) A1C/GTT Early: normal Third trimester: normal  Flu Vaccine 11/14/22    TDaP Vaccine  12/12/2022 Blood Type O/Positive/-- (09/12 1310)  Covid Vaccine Declined Antibody Negative (09/12 1310)  RSV Vaccine Declined Rubella 6.52 (09/12 1310)  Feeding Plan Bottle RPR Non Reactive (09/12 1310)  Contraception IP Nexplanon HBsAg Negative (09/12 1310)  Circumcision Girl HIV Non Reactive (09/12 1310)  Pediatrician  [facility] HCVAb Non Reactive (09/12 1310)  Prenatal Classes Declined      Pap Diagnosis  Date Value Ref Range Status  10/05/2022 (A)  Final   - Atypical squamous cells of undetermined significance (ASC-US) Negative HRHPV      GC/CT Initial:  neg/neg 36wks:  neg/neg    GBS  +urine     Past Medical History:  Diagnosis Date   Diet controlled gestational diabetes mellitus (GDM) in third trimester 03/19/2019    Past Surgical  History:  Procedure Laterality Date   CESAREAN SECTION N/A 04/30/2019   Procedure: CESAREAN SECTION;  Surgeon: Kathrynn Running, MD;  Location: [facility] LD ORS;  Service: Obstetrics;  Laterality: N/A;   WISDOM TOOTH EXTRACTION Bilateral     OB History  Gravida Para Term Preterm AB Living  3 2 2  0 0 2  SAB IAB Ectopic Multiple Live Births  0 0 0 0 2    # Outcome Date GA Lbr Len/2nd Weight Sex Type Anes PTL Lv  3 Current           2 Term 04/30/19 [redacted]w[redacted]d  2880 g M CS-Vac Spinal  LIV  1 Term 07/04/16 [redacted]w[redacted]d  3374 g F CS-Unspec EPI N LIV    Social History   Socioeconomic History   Marital status: Married    Spouse name: Systems developer   Number of children: 1   Years of education: Not on file   Highest education level: High school graduate  Occupational History   Occupation: unemployed  Tobacco Use   Smoking status: Never   Smokeless tobacco: Never  Vaping Use   Vaping status: Never Used  Substance and Sexual Activity   Alcohol use: Not Currently   Drug use: Never   Sexual activity: Yes  Other Topics Concern   Not on file  Social History Narrative   Not on file   Social  Drivers of Health   Financial Resource Strain: Low Risk  (10/15/2018)   Overall Financial Resource Strain (CARDIA)    Difficulty of Paying Living Expenses: Not hard at all  Food Insecurity: No Food Insecurity (03/10/2023)   Hunger Vital Sign    Worried About Running Out of Food in the Last Year: Never true    Ran Out of Food in the Last Year: Never true  Transportation Needs: No Transportation Needs (03/10/2023)   PRAPARE - Administrator, Civil Service (Medical): No    Lack of Transportation (Non-Medical): No  Physical Activity: Insufficiently Active (10/15/2018)   Exercise Vital Sign    Days of Exercise per Week: 3 days    Minutes of Exercise per Session: 30 min  Stress: No Stress Concern Present (10/15/2018)   [employer] of Occupational Health - Occupational Stress Questionnaire    Feeling of  Stress : Not at all  Social Connections: Moderately Integrated (03/10/2023)   Social Connection and Isolation Panel [NHANES]    Frequency of Communication with Friends and Family: Once a week    Frequency of Social Gatherings with Friends and Family: Once a week    Attends Religious Services: 1 to 4 times per year    Active Member of [employer] or Organizations: No    Attends Engineer, structural: 1 to 4 times per year    Marital Status: Married    Family History  Problem Relation Age of Onset   Hypertension Mother    Diabetes Maternal Grandmother     Medications Prior to Admission  Medication Sig Dispense Refill Last Dose/Taking   Prenatal Vit-Fe Fumarate-FA (PRENATAL MULTIVITAMIN) TABS tablet Take 1 tablet by mouth daily at 12 noon.   03/09/2023   cyclobenzaprine (FLEXERIL) 10 MG tablet Take 1 tablet (10 mg total) by mouth every 8 (eight) hours as needed for muscle spasms. (Patient not taking: Reported on 03/05/2023) 30 tablet 1 Not Taking    No Known Allergies  Review of Systems: Pertinent items noted in HPI and remainder of comprehensive ROS otherwise negative.  Physical Exam: BP 121/87   Pulse 90   Temp 98.8 F (37.1 C) (Oral)   Resp 17   Ht 5\' 3"  (1.6 m)   Wt 87.1 kg   LMP 05/18/2022   SpO2 97%   BMI 34.01 kg/m  FHR by Doppler: 131 bpm CONSTITUTIONAL: Well-developed, well-nourished female in no acute distress.  HENT:  Normocephalic, atraumatic, External right and left ear normal. Oropharynx is clear and moist EYES: Conjunctivae and EOM are normal. Pupils are equal, round, and reactive to light. No scleral icterus.  NECK: Normal range of motion, supple, no masses SKIN: Skin is warm and dry. No rash noted. Not diaphoretic. No erythema. No pallor. NEUROLOGIC: Alert and oriented to person, place, and time. Normal reflexes, muscle tone coordination. No cranial nerve deficit noted. PSYCHIATRIC: Normal mood and affect. Normal behavior. Normal judgment and thought  content. CARDIOVASCULAR: Normal heart rate noted, regular rhythm RESPIRATORY: Effort and breath sounds normal, no problems with respiration noted ABDOMEN: Soft, nontender, nondistended, gravid. Well-healed Pfannenstiel incision. PELVIC: Deferred MUSCULOSKELETAL: Normal range of motion. No edema and no tenderness. 2+ distal pulses.  Pertinent Labs/Studies:   Results for orders placed or performed during the hospital encounter of 03/10/23 (from the past week)  Prepare RBC (crossmatch)   Collection Time: 03/10/23  7:44 AM  Result Value Ref Range   Order Confirmation      ORDER PROCESSED BY BLOOD BANK Performed at  [facility], [address], [place], [place] [address]   Results for orders placed or performed during the hospital encounter of 03/08/23 (from the past week)  CBC   Collection Time: 03/08/23  9:49 AM  Result Value Ref Range   WBC 9.8 4.0 - 10.5 K/uL   RBC 4.09 3.87 - 5.11 MIL/uL   Hemoglobin 10.7 (L) 12.0 - 15.0 g/dL   HCT 65.7 (L) 84.6 - 96.2 %   MCV 81.7 80.0 - 100.0 fL   MCH 26.2 26.0 - 34.0 pg   MCHC 32.0 30.0 - 36.0 g/dL   RDW 95.2 84.1 - 32.4 %   Platelets 267 150 - 400 K/uL   nRBC 0.0 0.0 - 0.2 %  RPR   Collection Time: 03/08/23  9:49 AM  Result Value Ref Range   RPR Ser Ql NON REACTIVE NON REACTIVE  Basic metabolic panel   Collection Time: 03/08/23  9:49 AM  Result Value Ref Range   Sodium 133 (L) 135 - 145 mmol/L   Potassium 3.4 (L) 3.5 - 5.1 mmol/L   Chloride 109 98 - 111 mmol/L   CO2 16 (L) 22 - 32 mmol/L   Glucose, Bld 77 70 - 99 mg/dL   BUN 6 6 - 20 mg/dL   Creatinine, Ser 4.01 0.44 - 1.00 mg/dL   Calcium 8.6 (L) 8.9 - 10.3 mg/dL   GFR, Estimated >02 >72 mL/min   Anion gap 8 5 - 15  Type and screen [facility]   Collection Time: 03/08/23  9:54 AM  Result Value Ref Range   ABO/RH(D) O POS    Antibody Screen NEG    Sample Expiration      03/11/2023,2359 Performed at [facility], [address] [place],  [place] [address]    Unit Number [id number]    Blood Component Type RED CELLS,LR    Unit division 00    Status of Unit ALLOCATED    Transfusion Status OK TO TRANSFUSE    Crossmatch Result Compatible    Unit Number [id number]    Blood Component Type RED CELLS,LR    Unit division 00    Status of Unit ALLOCATED    Transfusion Status OK TO TRANSFUSE    Crossmatch Result Compatible   BPAM RBC   Collection Time: 03/08/23  9:54 AM  Result Value Ref Range   ISSUE DATE / TIME 166063016010    Blood Product Unit Number [id number]    PRODUCT CODE [id number]    Unit Type and Rh 5100    Blood Product Expiration Date [id number]    ISSUE DATE / TIME [id number]    Blood Product Unit Number [id number]    PRODUCT CODE [id number]    Unit Type and Rh 5100    Blood Product Expiration Date [id number]      Assessment and Plan: Tammie Diaz is a 28 y.o. G3P2002 at [redacted]w[redacted]d being admitted for scheduled repeat cesarean section. The risks of surgery were discussed with the patient including but were not limited to: bleeding which may require transfusion or reoperation; infection which may require antibiotics; injury to bowel, bladder, ureters or other surrounding organs; injury to the fetus; need for additional procedures including hysterectomy in the event of a life-threatening hemorrhage; formation of adhesions; placental abnormalities wth subsequent pregnancies; incisional problems; thromboembolic phenomenon and other postoperative/anesthesia complications. The patient concurred with the proposed plan, giving informed written consent for the procedure. Patient has been NPO since last night she will remain  NPO for procedure. Anesthesia and OR aware. Preoperative prophylactic antibiotics and SCDs ordered on call to the OR. To OR when ready.    Jaynie Collins, MD, FACOG Obstetrician & Gynecologist, [facility] for [employer], [facility]

## 2023-03-10 ENCOUNTER — Encounter (HOSPITAL_COMMUNITY)
Admission: RE | Disposition: A | Discharge status: Home / Self Care | Payer: Self-pay | Source: Home / Self Care | Attending: Obstetrics & Gynecology

## 2023-03-10 ENCOUNTER — Other Ambulatory Visit: Payer: Self-pay

## 2023-03-10 ENCOUNTER — Inpatient Hospital Stay (HOSPITAL_COMMUNITY)
Admission: RE | Admit: 2023-03-10 | Discharge: 2023-03-12 | DRG: 787 | Disposition: A | Discharge status: Home / Self Care | Payer: Medicaid Other | Attending: Obstetrics & Gynecology | Admitting: Obstetrics & Gynecology

## 2023-03-10 ENCOUNTER — Encounter (HOSPITAL_COMMUNITY): Payer: Self-pay | Admitting: Obstetrics & Gynecology

## 2023-03-10 ENCOUNTER — Inpatient Hospital Stay (HOSPITAL_COMMUNITY): Payer: Medicaid Other | Admitting: Anesthesiology

## 2023-03-10 DIAGNOSIS — Z833 Family history of diabetes mellitus: Secondary | ICD-10-CM | POA: Diagnosis not present

## 2023-03-10 DIAGNOSIS — Z3490 Encounter for supervision of normal pregnancy, unspecified, unspecified trimester: Secondary | ICD-10-CM

## 2023-03-10 DIAGNOSIS — Z3A39 39 weeks gestation of pregnancy: Secondary | ICD-10-CM

## 2023-03-10 DIAGNOSIS — Z30017 Encounter for initial prescription of implantable subdermal contraceptive: Secondary | ICD-10-CM | POA: Diagnosis not present

## 2023-03-10 DIAGNOSIS — O9081 Anemia of the puerperium: Secondary | ICD-10-CM | POA: Diagnosis not present

## 2023-03-10 DIAGNOSIS — O99210 Obesity complicating pregnancy, unspecified trimester: Secondary | ICD-10-CM | POA: Diagnosis present

## 2023-03-10 DIAGNOSIS — O34211 Maternal care for low transverse scar from previous cesarean delivery: Secondary | ICD-10-CM | POA: Diagnosis present

## 2023-03-10 DIAGNOSIS — D62 Acute posthemorrhagic anemia: Secondary | ICD-10-CM | POA: Diagnosis not present

## 2023-03-10 DIAGNOSIS — Z8249 Family history of ischemic heart disease and other diseases of the circulatory system: Secondary | ICD-10-CM

## 2023-03-10 DIAGNOSIS — O99214 Obesity complicating childbirth: Secondary | ICD-10-CM | POA: Diagnosis present

## 2023-03-10 DIAGNOSIS — Z98891 History of uterine scar from previous surgery: Principal | ICD-10-CM

## 2023-03-10 DIAGNOSIS — Z8632 Personal history of gestational diabetes: Secondary | ICD-10-CM

## 2023-03-10 DIAGNOSIS — Z560 Unemployment, unspecified: Secondary | ICD-10-CM | POA: Diagnosis not present

## 2023-03-10 DIAGNOSIS — O99820 Streptococcus B carrier state complicating pregnancy: Secondary | ICD-10-CM | POA: Diagnosis not present

## 2023-03-10 DIAGNOSIS — R8271 Bacteriuria: Secondary | ICD-10-CM | POA: Diagnosis present

## 2023-03-10 DIAGNOSIS — D500 Iron deficiency anemia secondary to blood loss (chronic): Secondary | ICD-10-CM | POA: Diagnosis present

## 2023-03-10 HISTORY — PX: LYSIS OF ADHESION: SHX5961

## 2023-03-10 LAB — PREPARE RBC (CROSSMATCH)

## 2023-03-10 SURGERY — Surgical Case
Anesthesia: Spinal | Site: Abdomen

## 2023-03-10 MED ORDER — OXYTOCIN-SODIUM CHLORIDE 30-0.9 UT/500ML-% IV SOLN: 2.5000 [IU]/h | INTRAVENOUS | Status: AC

## 2023-03-10 MED ORDER — OXYCODONE HCL 5 MG PO TABS: 5.0000 mg | ORAL_TABLET | ORAL | Status: DC | PRN

## 2023-03-10 MED ORDER — SODIUM CHLORIDE 0.9% FLUSH: 3.0000 mL | INTRAVENOUS | Status: DC | PRN

## 2023-03-10 MED ORDER — ACETAMINOPHEN 160 MG/5ML PO SOLN: 325.0000 mg | ORAL | Status: DC | PRN

## 2023-03-10 MED ORDER — NALOXONE HCL 0.4 MG/ML IJ SOLN: 0.4000 mg | INTRAMUSCULAR | Status: DC | PRN

## 2023-03-10 MED ORDER — ENOXAPARIN SODIUM 40 MG/0.4ML IJ SOSY
40.0000 mg | PREFILLED_SYRINGE | INTRAMUSCULAR | Status: DC
  Administered 2023-03-11: 40 mg via SUBCUTANEOUS
  Filled 2023-03-10: qty 0.4

## 2023-03-10 MED ORDER — FENTANYL CITRATE (PF) 100 MCG/2ML IJ SOLN
INTRAMUSCULAR | Status: DC | PRN
  Administered 2023-03-10: 15 ug via INTRATHECAL

## 2023-03-10 MED ORDER — ONDANSETRON HCL 4 MG/2ML IJ SOLN
INTRAMUSCULAR | Status: AC
  Filled 2023-03-10: qty 2

## 2023-03-10 MED ORDER — KETOROLAC TROMETHAMINE 30 MG/ML IJ SOLN
30.0000 mg | Freq: Four times a day (QID) | INTRAMUSCULAR | Status: AC
  Administered 2023-03-10: 30 mg via INTRAVENOUS
  Filled 2023-03-10 (×2): qty 1

## 2023-03-10 MED ORDER — DEXAMETHASONE SODIUM PHOSPHATE 10 MG/ML IJ SOLN
INTRAMUSCULAR | Status: DC | PRN
  Administered 2023-03-10: 10 mg via INTRAVENOUS

## 2023-03-10 MED ORDER — MEASLES, MUMPS & RUBELLA VAC IJ SOLR: 0.5000 mL | Freq: Once | INTRAMUSCULAR | Status: DC

## 2023-03-10 MED ORDER — SODIUM CHLORIDE 0.9 % IV SOLN
INTRAVENOUS | Status: AC
  Filled 2023-03-10: qty 2

## 2023-03-10 MED ORDER — WITCH HAZEL-GLYCERIN EX PADS: 1.0000 | MEDICATED_PAD | CUTANEOUS | Status: DC | PRN

## 2023-03-10 MED ORDER — POVIDONE-IODINE 10 % EX SWAB
2.0000 | Freq: Once | CUTANEOUS | Status: AC
  Administered 2023-03-10: 2 via TOPICAL

## 2023-03-10 MED ORDER — DIPHENHYDRAMINE HCL 25 MG PO CAPS: 25.0000 mg | ORAL_CAPSULE | ORAL | Status: DC | PRN

## 2023-03-10 MED ORDER — MEPERIDINE HCL 25 MG/ML IJ SOLN: 6.2500 mg | INTRAMUSCULAR | Status: DC | PRN

## 2023-03-10 MED ORDER — COCONUT OIL OIL: 1.0000 | TOPICAL_OIL | Status: DC | PRN

## 2023-03-10 MED ORDER — DIBUCAINE (PERIANAL) 1 % EX OINT: 1.0000 | TOPICAL_OINTMENT | CUTANEOUS | Status: DC | PRN

## 2023-03-10 MED ORDER — GABAPENTIN 100 MG PO CAPS
ORAL_CAPSULE | ORAL | Status: AC
  Filled 2023-03-10: qty 1

## 2023-03-10 MED ORDER — LACTATED RINGERS IV SOLN: INTRAVENOUS | Status: DC | PRN

## 2023-03-10 MED ORDER — MORPHINE SULFATE (PF) 0.5 MG/ML IJ SOLN
INTRAMUSCULAR | Status: AC
  Filled 2023-03-10: qty 10

## 2023-03-10 MED ORDER — IBUPROFEN 600 MG PO TABS
600.0000 mg | ORAL_TABLET | Freq: Four times a day (QID) | ORAL | Status: DC
  Administered 2023-03-11 (×2): 600 mg via ORAL
  Filled 2023-03-10 (×4): qty 1

## 2023-03-10 MED ORDER — FERROUS SULFATE 325 (65 FE) MG PO TABS
325.0000 mg | ORAL_TABLET | ORAL | Status: DC
  Administered 2023-03-11: 325 mg via ORAL
  Filled 2023-03-10: qty 1

## 2023-03-10 MED ORDER — SOD CITRATE-CITRIC ACID 500-334 MG/5ML PO SOLN
ORAL | Status: AC
  Filled 2023-03-10: qty 30

## 2023-03-10 MED ORDER — ACETAMINOPHEN 10 MG/ML IV SOLN: 1000.0000 mg | Freq: Once | INTRAVENOUS | Status: DC | PRN

## 2023-03-10 MED ORDER — LACTATED RINGERS IV SOLN: INTRAVENOUS | Status: DC

## 2023-03-10 MED ORDER — DIPHENHYDRAMINE HCL 50 MG/ML IJ SOLN: 12.5000 mg | INTRAMUSCULAR | Status: DC | PRN

## 2023-03-10 MED ORDER — SIMETHICONE 80 MG PO CHEW
80.0000 mg | CHEWABLE_TABLET | ORAL | Status: DC | PRN
  Administered 2023-03-11: 80 mg via ORAL
  Filled 2023-03-10: qty 1

## 2023-03-10 MED ORDER — NALOXONE HCL 4 MG/10ML IJ SOLN: 1.0000 ug/kg/h | INTRAVENOUS | Status: DC | PRN

## 2023-03-10 MED ORDER — MENTHOL 3 MG MT LOZG: 1.0000 | LOZENGE | OROMUCOSAL | Status: DC | PRN

## 2023-03-10 MED ORDER — GABAPENTIN 300 MG PO CAPS
300.0000 mg | ORAL_CAPSULE | ORAL | Status: AC
  Administered 2023-03-10: 300 mg via ORAL

## 2023-03-10 MED ORDER — HYDROMORPHONE HCL 2 MG PO TABS: 2.0000 mg | ORAL_TABLET | ORAL | Status: DC | PRN

## 2023-03-10 MED ORDER — BUPIVACAINE IN DEXTROSE 0.75-8.25 % IT SOLN
INTRATHECAL | Status: DC | PRN
  Administered 2023-03-10: 1.6 mL via INTRATHECAL

## 2023-03-10 MED ORDER — ONDANSETRON HCL 4 MG/2ML IJ SOLN: 4.0000 mg | Freq: Three times a day (TID) | INTRAMUSCULAR | Status: DC | PRN

## 2023-03-10 MED ORDER — TRANEXAMIC ACID-NACL 1000-0.7 MG/100ML-% IV SOLN
1000.0000 mg | Freq: Once | INTRAVENOUS | Status: AC
  Administered 2023-03-10: 1000 mg via INTRAVENOUS

## 2023-03-10 MED ORDER — ACETAMINOPHEN 500 MG PO TABS: 1000.0000 mg | ORAL_TABLET | ORAL | Status: DC

## 2023-03-10 MED ORDER — PHENYLEPHRINE HCL-NACL 20-0.9 MG/250ML-% IV SOLN
INTRAVENOUS | Status: DC | PRN
  Administered 2023-03-10: 60 ug/min via INTRAVENOUS

## 2023-03-10 MED ORDER — DIPHENHYDRAMINE HCL 25 MG PO CAPS: 25.0000 mg | ORAL_CAPSULE | Freq: Four times a day (QID) | ORAL | Status: DC | PRN

## 2023-03-10 MED ORDER — ZOLPIDEM TARTRATE 5 MG PO TABS: 5.0000 mg | ORAL_TABLET | Freq: Every evening | ORAL | Status: DC | PRN

## 2023-03-10 MED ORDER — ACETAMINOPHEN 10 MG/ML IV SOLN
INTRAVENOUS | Status: DC | PRN
  Administered 2023-03-10: 1000 mg via INTRAVENOUS

## 2023-03-10 MED ORDER — MORPHINE SULFATE (PF) 0.5 MG/ML IJ SOLN
INTRAMUSCULAR | Status: DC | PRN
  Administered 2023-03-10: 150 ug via INTRATHECAL

## 2023-03-10 MED ORDER — KETOROLAC TROMETHAMINE 30 MG/ML IJ SOLN: 30.0000 mg | Freq: Four times a day (QID) | INTRAMUSCULAR | Status: AC | PRN

## 2023-03-10 MED ORDER — PRENATAL MULTIVITAMIN CH
1.0000 | ORAL_TABLET | Freq: Every day | ORAL | Status: DC
  Administered 2023-03-11: 1 via ORAL
  Filled 2023-03-10: qty 1

## 2023-03-10 MED ORDER — SENNOSIDES-DOCUSATE SODIUM 8.6-50 MG PO TABS
2.0000 | ORAL_TABLET | Freq: Every day | ORAL | Status: DC
  Administered 2023-03-11 – 2023-03-12 (×2): 2 via ORAL
  Filled 2023-03-10 (×2): qty 2

## 2023-03-10 MED ORDER — ACETAMINOPHEN 325 MG PO TABS: 325.0000 mg | ORAL_TABLET | ORAL | Status: DC | PRN

## 2023-03-10 MED ORDER — GABAPENTIN 300 MG PO CAPS
ORAL_CAPSULE | ORAL | Status: AC
  Filled 2023-03-10: qty 1

## 2023-03-10 MED ORDER — ONDANSETRON HCL 4 MG/2ML IJ SOLN
INTRAMUSCULAR | Status: DC | PRN
  Administered 2023-03-10: 4 mg via INTRAVENOUS

## 2023-03-10 MED ORDER — ACETAMINOPHEN 500 MG PO TABS
1000.0000 mg | ORAL_TABLET | Freq: Four times a day (QID) | ORAL | Status: DC
  Administered 2023-03-10 – 2023-03-11 (×3): 1000 mg via ORAL
  Filled 2023-03-10 (×6): qty 2

## 2023-03-10 MED ORDER — GABAPENTIN 300 MG PO CAPS
300.0000 mg | ORAL_CAPSULE | Freq: Two times a day (BID) | ORAL | Status: DC
  Administered 2023-03-11 – 2023-03-12 (×3): 300 mg via ORAL
  Filled 2023-03-10 (×4): qty 1

## 2023-03-10 MED ORDER — MAGNESIUM HYDROXIDE 400 MG/5ML PO SUSP: 30.0000 mL | ORAL | Status: DC | PRN

## 2023-03-10 MED ORDER — SODIUM CHLORIDE 0.9 % IV SOLN
2.0000 g | INTRAVENOUS | Status: AC
  Administered 2023-03-10: 2 g via INTRAVENOUS

## 2023-03-10 MED ORDER — TETANUS-DIPHTH-ACELL PERTUSSIS 5-2.5-18.5 LF-MCG/0.5 IM SUSY: 0.5000 mL | PREFILLED_SYRINGE | Freq: Once | INTRAMUSCULAR | Status: DC

## 2023-03-10 MED ORDER — ETONOGESTREL 68 MG ~~LOC~~ IMPL
68.0000 mg | DRUG_IMPLANT | Freq: Once | SUBCUTANEOUS | Status: AC
  Administered 2023-03-12: 68 mg via SUBCUTANEOUS

## 2023-03-10 MED ORDER — LIDOCAINE HCL 1 % IJ SOLN
0.0000 mL | Freq: Once | INTRAMUSCULAR | Status: DC | PRN
  Filled 2023-03-10: qty 20

## 2023-03-10 MED ORDER — FENTANYL CITRATE (PF) 100 MCG/2ML IJ SOLN
INTRAMUSCULAR | Status: AC
  Filled 2023-03-10: qty 2

## 2023-03-10 MED ORDER — SCOPOLAMINE 1 MG/3DAYS TD PT72
1.0000 | MEDICATED_PATCH | Freq: Once | TRANSDERMAL | Status: DC
  Administered 2023-03-10: 1.5 mg via TRANSDERMAL
  Filled 2023-03-10: qty 1

## 2023-03-10 MED ORDER — SOD CITRATE-CITRIC ACID 500-334 MG/5ML PO SOLN
30.0000 mL | ORAL | Status: AC
  Administered 2023-03-10: 30 mL via ORAL

## 2023-03-10 MED ORDER — FENTANYL CITRATE (PF) 100 MCG/2ML IJ SOLN: 25.0000 ug | INTRAMUSCULAR | Status: DC | PRN

## 2023-03-10 SURGICAL SUPPLY — 30 items
BENZOIN TINCTURE PRP APPL 2/3 (GAUZE/BANDAGES/DRESSINGS) IMPLANT
CHLORAPREP W/TINT 26 (MISCELLANEOUS) ×4 IMPLANT
CLAMP UMBILICAL CORD (MISCELLANEOUS) ×2 IMPLANT
CLOTH BEACON ORANGE TIMEOUT ST (SAFETY) ×2 IMPLANT
DRSG OPSITE POSTOP 4X10 (GAUZE/BANDAGES/DRESSINGS) ×2 IMPLANT
ELECT REM PT RETURN 9FT ADLT (ELECTROSURGICAL) ×2
ELECTRODE REM PT RTRN 9FT ADLT (ELECTROSURGICAL) ×2 IMPLANT
EXTRACTOR VACUUM M CUP 4 TUBE (SUCTIONS) IMPLANT
GLOVE BIOGEL PI IND STRL 7.0 (GLOVE) ×6 IMPLANT
GLOVE ECLIPSE 7.0 STRL STRAW (GLOVE) ×2 IMPLANT
GOWN STRL REUS W/TWL LRG LVL3 (GOWN DISPOSABLE) ×2 IMPLANT
GOWN STRL REUS W/TWL XL LVL3 (GOWN DISPOSABLE) ×2 IMPLANT
KIT ABG SYR 3ML LUER SLIP (SYRINGE) IMPLANT
NDL HYPO 25X5/8 SAFETYGLIDE (NEEDLE) ×2 IMPLANT
NEEDLE HYPO 22GX1.5 SAFETY (NEEDLE) ×2 IMPLANT
NEEDLE HYPO 25X5/8 SAFETYGLIDE (NEEDLE) ×2
NS IRRIG 1000ML POUR BTL (IV SOLUTION) ×2 IMPLANT
PACK C SECTION WH (CUSTOM PROCEDURE TRAY) ×2 IMPLANT
PAD ABD 7.5X8 STRL (GAUZE/BANDAGES/DRESSINGS) ×2 IMPLANT
PAD OB MATERNITY 4.3X12.25 (PERSONAL CARE ITEMS) ×2 IMPLANT
RTRCTR C-SECT PINK 25CM LRG (MISCELLANEOUS) IMPLANT
STRIP CLOSURE SKIN 1/2X4 (GAUZE/BANDAGES/DRESSINGS) IMPLANT
SUT PDS AB 0 CTX 36 PDP370T (SUTURE) IMPLANT
SUT PLAIN 2 0 XLH (SUTURE) IMPLANT
SUT VIC AB 0 CTX36XBRD ANBCTRL (SUTURE) ×4 IMPLANT
SUT VIC AB 4-0 KS 27 (SUTURE) ×2 IMPLANT
SYR CONTROL 10ML LL (SYRINGE) ×2 IMPLANT
TOWEL OR 17X24 6PK STRL BLUE (TOWEL DISPOSABLE) ×2 IMPLANT
TRAY FOLEY W/BAG SLVR 14FR LF (SET/KITS/TRAYS/PACK) ×2 IMPLANT
WATER STERILE IRR 1000ML POUR (IV SOLUTION) ×2 IMPLANT

## 2023-03-10 NOTE — Op Note (Signed)
Tammie Diaz PROCEDURE DATE: 03/10/2023  PREOPERATIVE DIAGNOSES: Intrauterine pregnancy at [redacted]w[redacted]d weeks gestation;  two previous cesarean sections  POSTOPERATIVE DIAGNOSES: The same  PROCEDURE: Low Transverse Cesarean Section, Lysis of Omental Adhesions  SURGEON:  Dr. Jaynie Collins  ASSISTANT:  Dr. Sundra Aland. An experienced assistant was required given the standard of surgical care given the complexity of the case.  This assistant was needed for exposure, dissection, suctioning, retraction, instrument exchange, assisting with delivery with administration of fundal pressure, and for overall help during the procedure.  ANESTHESIOLOGY TEAM: Anesthesiologist: Shelton Silvas, MD CRNA: Algis Greenhouse, CRNA  INDICATIONS: Tammie Diaz is a 28 y.o. [id number] at [redacted]w[redacted]d here for repeat cesarean section secondary to the indications listed under preoperative diagnoses; please see preoperative note for further details.  The risks of surgery were discussed with the patient including but were not limited to: bleeding which may require transfusion or reoperation; infection which may require antibiotics; injury to bowel, bladder, ureters or other surrounding organs; injury to the fetus; need for additional procedures including hysterectomy in the event of a life-threatening hemorrhage; formation of adhesions; placental abnormalities wth subsequent pregnancies; incisional problems; thromboembolic phenomenon and other postoperative/anesthesia complications.  The patient concurred with the proposed plan, giving informed written consent for the procedure.    FINDINGS:  Viable female infant in cephalic presentation.  Apgars 9 and 9, weight 2960 g.  Clear amniotic fluid.  Intact placenta, three vessel cord.  Normal uterus, fallopian tubes and ovaries bilaterally.  Thick sheet of mental adhesions adherent to anterior abdominal wall and blocking access to uterus, this was taken down sharply and suture ligated to  allow access to uterus.  Otherwise, minimal intraperitoneal adhesive disease.  ANESTHESIA: Spinal ESTIMATED BLOOD LOSS: 291 ml SPECIMENS: Placenta sent to L&D COMPLICATIONS: None immediate  PROCEDURE IN DETAIL:  The patient preoperatively received intravenous antibiotics and had sequential compression devices applied to her lower extremities.  She was then taken to the operating room where spinal anesthesia was administered and was found to be adequate. She was then placed in a dorsal supine position with a leftward tilt, and prepped and draped in a sterile manner.  A foley catheter was placed into her bladder and attached to constant gravity.  After an adequate timeout was performed, a Pfannenstiel skin incision was made with scalpel on her preexisting scar and carried through to the underlying layer of fascia. The fascia was incised in the midline, and this incision was extended bilaterally using the Mayo scissors.  Kocher clamps were applied to the superior aspect of the fascial incision and the underlying rectus muscles were dissected off sharply.  A similar process was carried out on the inferior aspect of the fascial incision. The rectus muscles were separated in the midline and the peritoneum was entered sharply. The obstructing, thick omental adhesion was immediately noted, and there was not way to access the uterus.  The decision was made to lyse the adhesion, this was done by serially clamping, cutting and doubly suture ligating sections of the adhesion with 2-0 Vicryl.  The Alexis self-retaining retractor was then introduced into the abdominal cavity.  Attention was turned to the lower uterine segment where a low transverse hysterotomy was made with a scalpel and extended bilaterally bluntly.  The infant was successfully delivered, the cord was clamped and cut after one minute, and the infant was handed over to the awaiting neonatology team. Uterine massage was then administered, and the placenta  delivered intact with a three-vessel cord.  The uterus was then cleared of clots and debris.  The hysterotomy was closed with 0 Vicryl in a running locked fashion, and figure-of-eight 0 Vicryl serosal stitches were placed to help with hemostasis.  The pelvis was cleared of all clot and debris. Hemostasis was confirmed on all surfaces.  The retractor was removed.  The rectus muscles were reapproximated using 2-0 Vicryl interrupted stitches. The fascia was then closed using 0 PDS in a running fashion.  The subcutaneous layer was irrigated, reapproximated with 2-0 plain gut interrupted stitches, and the skin was closed with a 4-0 Vicryl subcuticular stitch. The patient tolerated the procedure well. Sponge, instrument and needle counts were correct x 3.  She was taken to the recovery room in stable condition.    Jaynie Collins, MD, FACOG Obstetrician & Gynecologist, [facility] for [employer], [facility]

## 2023-03-10 NOTE — Discharge Summary (Signed)
Postpartum Discharge Summary  Patient Name: Tammie Diaz DOB: Oct 11, 1995 MRN: [id number]  Date of admission: 03/10/2023 Delivery date:03/10/2023 Delivering provider: Jaynie Collins A Date of discharge: 03/12/2023  Admitting diagnosis: S/P cesarean section [Z98.891] Intrauterine pregnancy: [redacted]w[redacted]d     Secondary diagnosis:  Principal Problem:   S/P cesarean section Active Problems:   Supervision of low-risk pregnancy   History of 2 cesarean sections   GBS bacteriuria   Obesity affecting pregnancy, antepartum   Normocytic anemia due to blood loss  Additional problems: None    Discharge diagnosis: Term Pregnancy Delivered                                              Post partum procedures: Nexplanon insertion Augmentation: N/A Complications: None  Hospital course: Scheduled C/S   28 y.o. yo G3P3003 at [redacted]w[redacted]d was admitted to the hospital 03/10/2023 for scheduled cesarean section with the following indication: Elective Repeat. Delivery details are as follows:  Membrane Rupture Time/Date: 10:15 AM,03/10/2023  Delivery Method:C-Section, Low Transverse Operative Delivery:N/A Details of operation can be found in separate operative note.  Patient had a postpartum course complicated by none.  She is ambulating, tolerating a regular diet, passing flatus, and urinating well. Patient is discharged home in stable condition on  03/12/23        Newborn Data: Birth date:03/10/2023 Birth time:10:16 AM Gender:Female Living status:Living Apgars:9 ,9  Weight:2960 g    Magnesium Sulfate received: No BMZ received: No Rhophylac:N/A MMR:N/A T-DaP:Given prenatally Flu: Yes RSV Vaccine received: No Transfusion:No  Immunizations received: Immunization History  Administered Date(s) Administered   Influenza, Seasonal, Injecte, Preservative Fre 11/14/2022   Influenza,inj,Quad PF,6+ Mos 10/30/2018   Tdap 02/18/2019, 12/12/2022    Physical exam  Vitals:   03/11/23 0355 03/11/23 1255 03/11/23  2325 03/12/23 0520  BP: (!) 105/47 103/67 114/75 102/71  Pulse: 77 82 76 77  Resp: 14 16 18 16   Temp: 98.6 F (37 C) 98.1 F (36.7 C) 97.6 F (36.4 C) 98 F (36.7 C)  TempSrc: Oral Oral Oral Oral  SpO2: 98% 97% 99% 98%  Weight:      Height:       General: alert, cooperative, and no distress Lochia: appropriate Uterine Fundus: firm Incision: Dressing is clean, dry, and intact DVT Evaluation: No evidence of DVT seen on physical exam. Labs: Lab Results  Component Value Date   WBC 9.8 03/11/2023   HGB 8.7 (L) 03/11/2023   HCT 26.9 (L) 03/11/2023   MCV 82.3 03/11/2023   PLT 226 03/11/2023      Latest Ref Rng & Units 03/08/2023    9:49 AM  CMP  Glucose 70 - 99 mg/dL 77   BUN 6 - 20 mg/dL 6   Creatinine 3.01 - 6.01 mg/dL 0.93   Sodium 235 - 573 mmol/L 133   Potassium 3.5 - 5.1 mmol/L 3.4   Chloride 98 - 111 mmol/L 109   CO2 22 - 32 mmol/L 16   Calcium 8.9 - 10.3 mg/dL 8.6    Edinburgh Score:    03/11/2023    7:43 AM  Edinburgh Postnatal Depression Scale Screening Tool  I have been able to laugh and see the funny side of things. 0  I have looked forward with enjoyment to things. 0  I have blamed myself unnecessarily when things went wrong. 0  I have been anxious  or worried for no good reason. 0  I have felt scared or panicky for no good reason. 0  Things have been getting on top of me. 0  I have been so unhappy that I have had difficulty sleeping. 0  I have felt sad or miserable. 0  I have been so unhappy that I have been crying. 0  The thought of harming myself has occurred to me. 0  Edinburgh Postnatal Depression Scale Total 0   Edinburgh Postnatal Depression Scale Total: 0   After visit meds:  Allergies as of 03/12/2023   No Known Allergies      Medication List     STOP taking these medications    cyclobenzaprine 10 MG tablet Commonly known as: FLEXERIL       TAKE these medications    oxyCODONE 5 MG immediate release tablet Commonly known as:  Oxy IR/ROXICODONE Take 1-2 tablets (5-10 mg total) by mouth every 4 (four) hours as needed for moderate pain (pain score 4-6).   prenatal multivitamin Tabs tablet Take 1 tablet by mouth daily at 12 noon.   senna-docusate 8.6-50 MG tablet Commonly known as: Senokot-S Take 2 tablets by mouth daily. Start taking on: March 13, 2023         Discharge home in stable condition Infant Feeding: Bottle and Breast Infant Disposition:home with mother Discharge instruction: per After Visit Summary and Postpartum booklet. Activity: Advance as tolerated. Pelvic rest for 6 weeks.  Diet: routine diet Future Appointments:No future appointments. Follow up Visit:  Follow-up Information     [facility] Follow up in 1 week(s).   Specialty: Obstetrics and Gynecology Why: Incision check Contact information: [address] [place] [address] [phone]        [facility] Follow up today.   Specialty: Obstetrics and Gynecology Why: Postpartum follow up at 4-6 weeks Contact information: [address] [place] [address] [phone]               Message sent to [facility] 2/15  Please schedule this patient for a In person postpartum visit in 4 weeks with the following provider: Any provider. Additional Postpartum F/U:Incision check 1 week  Low risk pregnancy complicated by:  hx C/S Delivery mode:  C-Section, Low Transverse Anticipated Birth Control:  PP Nexplanon placed   03/12/2023 Joanne Gavel, MD OB Fellow

## 2023-03-10 NOTE — Anesthesia Procedure Notes (Signed)
Spinal  Start time: 03/10/2023 9:30 AM End time: 03/10/2023 9:32 AM Reason for block: surgical anesthesia Staffing Performed: anesthesiologist  Anesthesiologist: Shelton Silvas, MD Performed by: Shelton Silvas, MD Authorized by: Shelton Silvas, MD   Preanesthetic Checklist Completed: patient identified, IV checked, site marked, risks and benefits discussed, surgical consent, monitors and equipment checked, pre-op evaluation and timeout performed Spinal Block Patient position: sitting Prep: DuraPrep and site prepped and draped Location: L3-4 Injection technique: single-shot Needle Needle type: Pencan  Needle gauge: 24 G Needle length: 10 cm Needle insertion depth: 10 cm Additional Notes Patient tolerated well. No immediate complications.  Functioning IV was confirmed and monitors were applied. Sterile prep and drape, including hand hygiene and sterile gloves were used. The patient was positioned and the back was prepped. The skin was anesthetized with lidocaine. Free flow of clear CSF was obtained prior to injecting local anesthetic into the CSF. The spinal needle aspirated freely following injection. The needle was carefully withdrawn. The patient tolerated the procedure well.

## 2023-03-10 NOTE — Anesthesia Postprocedure Evaluation (Signed)
Anesthesia Post Note  Patient: [name]  Procedure(s) Performed: CESAREAN SECTION LYSIS OF ADHESION (Abdomen)     Patient location during evaluation: PACU Anesthesia Type: Spinal Level of consciousness: oriented and awake and alert Pain management: pain level controlled Vital Signs Assessment: post-procedure vital signs reviewed and stable Respiratory status: spontaneous breathing, respiratory function stable and patient connected to nasal cannula oxygen Cardiovascular status: blood pressure returned to baseline and stable Postop Assessment: no headache, no backache and no apparent nausea or vomiting Anesthetic complications: no  No notable events documented.  Last Vitals:  Vitals:   03/10/23 1230 03/10/23 1334  BP: 119/82 105/73  Pulse: 77 72  Resp: 15 15  Temp: (!) 36.4 C 36.6 C  SpO2: 97% 98%    Last Pain:  Vitals:   03/10/23 1334  TempSrc: Oral  PainSc:    Pain Goal:                Epidural/Spinal Function Cutaneous sensation: Tingles (03/10/23 1324), Patient able to flex knees: Yes (03/10/23 1324), Patient able to lift hips off bed: Yes (03/10/23 1324), Back pain beyond tenderness at insertion site: No (03/10/23 1324), Progressively worsening motor and/or sensory loss: No (03/10/23 1324), Bowel and/or bladder incontinence post epidural: No (03/10/23 1324)  Tammie Diaz

## 2023-03-10 NOTE — Anesthesia Preprocedure Evaluation (Addendum)
Anesthesia Evaluation  Patient identified by MRN, date of birth, ID band Patient awake    Reviewed: Allergy & Precautions, NPO status , Patient's Chart, lab work & pertinent test results  Airway Mallampati: II       Dental no notable dental hx.    Pulmonary    Pulmonary exam normal        Cardiovascular negative cardio ROS Normal cardiovascular exam     Neuro/Psych  negative psych ROS   GI/Hepatic negative GI ROS,,,  Endo/Other  diabetes, Gestational    Renal/GU      Musculoskeletal   Abdominal   Peds  Hematology negative hematology ROS (+)   Anesthesia Other Findings   Reproductive/Obstetrics (+) Pregnancy                             Anesthesia Physical Anesthesia Plan  ASA: 2  Anesthesia Plan: Spinal   Post-op Pain Management:    Induction:   PONV Risk Score and Plan: 0  Airway Management Planned: Natural Airway  Additional Equipment: None  Intra-op Plan:   Post-operative Plan:   Informed Consent: I have reviewed the patients History and Physical, chart, labs and discussed the procedure including the risks, benefits and alternatives for the proposed anesthesia with the patient or authorized representative who has indicated his/her understanding and acceptance.       Plan Discussed with: CRNA  Anesthesia Plan Comments: (Lab Results      Component                Value               Date                      WBC                      9.8                 03/08/2023                HGB                      10.7 (L)            03/08/2023                HCT                      33.4 (L)            03/08/2023                MCV                      81.7                03/08/2023                PLT                      267                 03/08/2023           )       Anesthesia Quick Evaluation

## 2023-03-10 NOTE — Transfer of Care (Signed)
Immediate Anesthesia Transfer of Care Note  Patient: Tammie Diaz  Procedure(s) Performed: CESAREAN SECTION LYSIS OF ADHESION (Abdomen)  Patient Location: PACU  Anesthesia Type:Spinal  Level of Consciousness: awake  Airway & Oxygen Therapy: Patient Spontanous Breathing  Post-op Assessment: Report given to RN  Post vital signs: Reviewed and stable  Last Vitals:  Vitals Value Taken Time  BP 108/72 03/10/23 1110  Temp    Pulse 59 03/10/23 1111  Resp 16 03/10/23 1111  SpO2 97 % 03/10/23 1111  Vitals shown include unfiled device data.  Last Pain:  Vitals:   03/10/23 0738  TempSrc: Oral  PainSc: 0-No pain         Complications: No notable events documented.

## 2023-03-10 NOTE — Lactation Note (Signed)
This note was copied from a baby's chart. Lactation Consultation Note  Patient Name: Tammie Diaz [id number] Date: 03/10/2023 Age:26 hours  Mother states she prefers to only formula feed.   Feeding Mother's Current Feeding Choice: Formula Nipple Type: Slow - flow  Consult Status: Complete    Hardie Pulley 03/10/2023, 1:51 PM

## 2023-03-11 LAB — TYPE AND SCREEN
ABO/RH(D): O POS
Antibody Screen: NEGATIVE
Unit division: 0
Unit division: 0

## 2023-03-11 LAB — CBC
HCT: 26.9 % — ABNORMAL LOW (ref 36.0–46.0)
Hemoglobin: 8.7 g/dL — ABNORMAL LOW (ref 12.0–15.0)
MCH: 26.6 pg (ref 26.0–34.0)
MCHC: 32.3 g/dL (ref 30.0–36.0)
MCV: 82.3 fL (ref 80.0–100.0)
Platelets: 226 10*3/uL (ref 150–400)
RBC: 3.27 MIL/uL — ABNORMAL LOW (ref 3.87–5.11)
RDW: 14.6 % (ref 11.5–15.5)
WBC: 9.8 10*3/uL (ref 4.0–10.5)
nRBC: 0 % (ref 0.0–0.2)

## 2023-03-11 LAB — BPAM RBC
Blood Product Expiration Date: 202503122359
Blood Product Expiration Date: 202503122359
ISSUE DATE / TIME: 202502150004
ISSUE DATE / TIME: 202502150004
Unit Type and Rh: 5100
Unit Type and Rh: 5100

## 2023-03-11 MED ORDER — LACTATED RINGERS IV BOLUS
1000.0000 mL | Freq: Once | INTRAVENOUS | Status: AC
  Administered 2023-03-11: 1000 mL via INTRAVENOUS

## 2023-03-11 NOTE — Progress Notes (Signed)
Subjective: Postpartum Day 1: Cesarean Delivery Patient reports tolerating PO.  Recently had foley catheter removed and has not voided spontaneously yet. Has not been passing gas. Denies pain and pain medication, she does not feel like she needs at this time. Has been ambulating around the room, denies dizziness. No other concerns at this time.  Objective: Vital signs in last 24 hours: Temp:  [97.5 F (36.4 C)-99 F (37.2 C)] 98.6 F (37 C) (02/16 0355) Pulse Rate:  [59-89] 77 (02/16 0355) Resp:  [10-23] 14 (02/16 0355) BP: (101-121)/(47-82) 105/47 (02/16 0355) SpO2:  [95 %-98 %] 98 % (02/16 0355)  Physical Exam:  General: alert and cooperative Lochia: appropriate Uterine Fundus: firm Incision: no significant drainage, no significant erythema, Honey comb dressing intact DVT Evaluation: No evidence of DVT seen on physical exam.  Recent Labs    03/08/23 0949 03/11/23 0500  HGB 10.7* 8.7*  HCT 33.4* 26.9*    Assessment/Plan: RLTCS POD 1  Status post Cesarean section. Doing well postoperatively.  Continue current care. Bottle feeding, desires Nexplanon for contraception inpatient.   Herminio Commons, Student-MidWife 03/11/2023, 7:52 AM

## 2023-03-12 ENCOUNTER — Encounter (HOSPITAL_COMMUNITY): Payer: Self-pay | Admitting: Obstetrics & Gynecology

## 2023-03-12 DIAGNOSIS — Z30017 Encounter for initial prescription of implantable subdermal contraceptive: Secondary | ICD-10-CM

## 2023-03-12 DIAGNOSIS — D500 Iron deficiency anemia secondary to blood loss (chronic): Secondary | ICD-10-CM | POA: Diagnosis present

## 2023-03-12 MED ORDER — SENNOSIDES-DOCUSATE SODIUM 8.6-50 MG PO TABS: 2.0000 | ORAL_TABLET | Freq: Every day | ORAL | 1 refills | Status: AC

## 2023-03-12 MED ORDER — OXYCODONE HCL 5 MG PO TABS
5.0000 mg | ORAL_TABLET | ORAL | 0 refills | Status: AC | PRN
Start: 2023-03-12 — End: ?

## 2023-03-12 MED ORDER — LIDOCAINE HCL 1 % IJ SOLN
0.0000 mL | Freq: Once | INTRAMUSCULAR | Status: AC | PRN
  Administered 2023-03-12: 3 mL via INTRADERMAL

## 2023-03-12 MED ORDER — ETONOGESTREL 68 MG ~~LOC~~ IMPL
68.0000 mg | DRUG_IMPLANT | Freq: Once | SUBCUTANEOUS | Status: AC
  Filled 2023-03-12: qty 1

## 2023-03-12 NOTE — Procedures (Signed)
Post-Placental Nexplanon Insertion Procedure Note  Patient was identified. Informed consent was signed, signed copy in chart. A time-out was performed.    The insertion site was identified 8-10 cm (3-4 inches) from the medial epicondyle of the humerus and 3-5 cm (1.25-2 inches) posterior to (below) the sulcus (groove) between the biceps and triceps muscles of the patient's left arm and marked. The site was prepped in the usual sterile fashion. Pt was prepped with alcohol swab and then injected with 2 cc of 1% lidocaine.  The site was prepped with betadine. Nexplanon removed form packaging,  Device confirmed in needle, then inserted full length of needle and withdrawn per handbook instructions. Provider and patient verified presence of the implant in the woman's arm by palpation. Pt insertion site was covered with adhesive bandage and pressure bandage. There was minimal blood loss. Patient tolerated procedure well.  Patient was given post procedure instructions and Nexplanon user card with expiration date. Condoms were recommended for STI prevention. Patient was asked to keep the pressure dressing on for 24 hours to minimize bruising and keep the adhesive bandage on for 3-5 days. The patient verbalized understanding of the plan of care and agrees.    Joanne Gavel, MD

## 2023-03-21 ENCOUNTER — Other Ambulatory Visit: Payer: Self-pay

## 2023-03-21 ENCOUNTER — Ambulatory Visit: Payer: Medicaid Other | Admitting: *Deleted

## 2023-03-21 VITALS — BP 112/76 | HR 76 | Ht 63.0 in | Wt 177.7 lb

## 2023-03-21 DIAGNOSIS — Z4889 Encounter for other specified surgical aftercare: Secondary | ICD-10-CM

## 2023-03-21 MED ORDER — IBUPROFEN 600 MG PO TABS: 600.0000 mg | ORAL_TABLET | Freq: Four times a day (QID) | ORAL | 1 refills | Status: AC | PRN

## 2023-03-21 NOTE — Progress Notes (Signed)
 Pt presents for incision check following repeat LTCS on 03/10/23. She reports occasional mild abdominal pain which is relieved with Oxycodone however she does not want to take regularly. Rx for Ibuprofen e-prescribed. Steri-strips removed from operative site and incision was found to be well healed. No redness, swelling or drainage was observed. Proper daily cleansing regimen of incision was explained. Pt will keep PP appt as scheduled.

## 2023-04-16 ENCOUNTER — Ambulatory Visit: Payer: Medicaid Other | Admitting: Obstetrics and Gynecology

## 2023-05-23 ENCOUNTER — Ambulatory Visit: Admitting: Obstetrics and Gynecology

## 2023-06-04 ENCOUNTER — Ambulatory Visit: Admitting: Obstetrics and Gynecology

## 2023-11-05 ENCOUNTER — Ambulatory Visit: Admitting: Obstetrics and Gynecology
